# Patient Record
Sex: Male | Born: 1980
Health system: Southern US, Community
[De-identification: ages and names within clinical notes are randomized; demographics above are authoritative.]

## PROBLEM LIST (undated history)

## (undated) DIAGNOSIS — J45909 Unspecified asthma, uncomplicated: Secondary | ICD-10-CM

## (undated) DIAGNOSIS — T8859XA Other complications of anesthesia, initial encounter: Secondary | ICD-10-CM

## (undated) DIAGNOSIS — T7840XA Allergy, unspecified, initial encounter: Secondary | ICD-10-CM

## (undated) DIAGNOSIS — R531 Weakness: Secondary | ICD-10-CM

## (undated) DIAGNOSIS — T4145XA Adverse effect of unspecified anesthetic, initial encounter: Secondary | ICD-10-CM

## (undated) HISTORY — DX: Allergy, unspecified, initial encounter: T78.40XA

## (undated) HISTORY — PX: HERNIA REPAIR: SHX51

## (undated) HISTORY — DX: Unspecified asthma, uncomplicated: J45.909

## (undated) HISTORY — PX: HAND SURGERY: SHX662

---
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
## 2006-02-16 ENCOUNTER — Emergency Department (HOSPITAL_COMMUNITY): Admission: EM | Admit: 2006-02-16 | Discharge: 2006-02-16 | Payer: Self-pay | Admitting: Emergency Medicine

## 2008-11-19 ENCOUNTER — Ambulatory Visit: Payer: Self-pay | Admitting: Radiology

## 2008-11-19 ENCOUNTER — Emergency Department (HOSPITAL_BASED_OUTPATIENT_CLINIC_OR_DEPARTMENT_OTHER): Admission: EM | Admit: 2008-11-19 | Discharge: 2008-11-19 | Payer: Self-pay | Admitting: Emergency Medicine

## 2011-06-25 ENCOUNTER — Emergency Department (HOSPITAL_BASED_OUTPATIENT_CLINIC_OR_DEPARTMENT_OTHER)
Admission: EM | Admit: 2011-06-25 | Discharge: 2011-06-25 | Disposition: A | Discharge status: Home / Self Care | Payer: Self-pay | Attending: Emergency Medicine | Admitting: Emergency Medicine

## 2011-06-25 ENCOUNTER — Emergency Department (HOSPITAL_BASED_OUTPATIENT_CLINIC_OR_DEPARTMENT_OTHER): Payer: Self-pay

## 2011-06-25 ENCOUNTER — Encounter (HOSPITAL_BASED_OUTPATIENT_CLINIC_OR_DEPARTMENT_OTHER): Payer: Self-pay | Admitting: Family Medicine

## 2011-06-25 DIAGNOSIS — S91312A Laceration without foreign body, left foot, initial encounter: Secondary | ICD-10-CM

## 2011-06-25 DIAGNOSIS — W268XXA Contact with other sharp object(s), not elsewhere classified, initial encounter: Secondary | ICD-10-CM | POA: Insufficient documentation

## 2011-06-25 DIAGNOSIS — S91309A Unspecified open wound, unspecified foot, initial encounter: Secondary | ICD-10-CM | POA: Insufficient documentation

## 2011-06-25 MED ORDER — LIDOCAINE HCL 2 % IJ SOLN
INTRAMUSCULAR | Status: AC
  Administered 2011-06-25: 40 mg
  Filled 2011-06-25: qty 1

## 2011-06-25 NOTE — ED Provider Notes (Signed)
Medical screening examination/treatment/procedure(s) were performed by non-physician practitioner and as supervising physician I was immediately available for consultation/collaboration.   Carylon Tamburro, MD 06/25/11 2353 

## 2011-06-25 NOTE — ED Provider Notes (Signed)
History     CSN: [id number]  Arrival date & time 06/25/11  1733   First MD Initiated Contact with Patient 06/25/11 2026      Chief Complaint  Patient presents with  . Extremity Laceration    (Consider location/radiation/quality/duration/timing/severity/associated sxs/prior treatment) Patient is a 31 y.o. male presenting with skin laceration. The history is provided by the patient. No language interpreter was used.  Laceration  The incident occurred 1 to 2 hours ago. The laceration is located on the left foot. The laceration is 2 cm in size. The laceration mechanism was a a metal edge. The pain is at a severity of 4/10. The pain is moderate. He reports no foreign bodies present. His tetanus status is UTD.   Pt complains of a laceration between his 4th adn 5th toe History reviewed. No pertinent past medical history.  Past Surgical History  Procedure Date  . Hernia repair   . Hand surgery     No family history on file.  History  Substance Use Topics  . Smoking status: Never Smoker   . Smokeless tobacco: Not on file  . Alcohol Use: Yes      Review of Systems  Skin: Positive for wound.  All other systems reviewed and are negative.    Allergies  Review of patient's allergies indicates no known allergies.  Home Medications  No current outpatient prescriptions on file.  BP 130/82  Pulse 84  Temp 98.2 F (36.8 C) (Oral)  Resp 15  Ht 6\' 1"  (1.854 m)  Wt 228 lb (103.42 kg)  BMI 30.08 kg/m2  SpO2 99%  Physical Exam  Nursing note and vitals reviewed. Constitutional: He is oriented to person, place, and time. He appears well-developed and well-nourished.  Musculoskeletal: He exhibits tenderness.       2 cm laceration between 4th and 5th finger  Neurological: He is alert and oriented to person, place, and time. He has normal reflexes.  Skin: Skin is warm.  Psychiatric: He has a normal mood and affect.    ED Course  LACERATION REPAIR Date/Time: 06/25/2011 9:11  PM Performed by: Elson Areas Authorized by: Elson Areas Consent: Verbal consent obtained. Consent given by: patient Body area: lower extremity Laceration length: 2 cm Tendon involvement: none Nerve involvement: none Vascular damage: no Anesthesia: local infiltration Local anesthetic: lidocaine 2% without epinephrine Irrigation solution: saline Skin closure: 5-0 nylon Number of sutures: 2 Technique: simple Approximation: loose Approximation difficulty: simple Patient tolerance: Patient tolerated the procedure well with no immediate complications.   (including critical care time)  Labs Reviewed - No data to display Dg Foot Complete Left  06/25/2011  *RADIOLOGY REPORT*  Clinical Data: 31 year old male with laceration on the plantar side of the left fifth toe.  LEFT FOOT - COMPLETE 3+ VIEW  Comparison: None.  Findings: Bone mineralization is within normal limits.  Normal joint spaces.  Calcaneus intact.  Osseous structures of the left fifth ray appear intact and within normal limits.  No acute fracture identified. No radiopaque foreign body identified.  No subcutaneous gas identified.  IMPRESSION: No acute osseous abnormality identified about the left foot.  No radiopaque foreign body identified.  Original Report Authenticated By: Harley Hallmark, M.D.     1. Laceration of left foot       MDM  Suture removal in 8 days        Lonia Skinner [place], [place] 06/25/11 2112

## 2011-06-25 NOTE — ED Notes (Signed)
Pt has laceration from wire basket to left foot between pinky and 4th toes. Bleeding controlled.

## 2011-06-25 NOTE — Discharge Instructions (Signed)

## 2013-08-09 DIAGNOSIS — S93409A Sprain of unspecified ligament of unspecified ankle, initial encounter: Secondary | ICD-10-CM | POA: Insufficient documentation

## 2014-02-24 ENCOUNTER — Encounter (HOSPITAL_BASED_OUTPATIENT_CLINIC_OR_DEPARTMENT_OTHER): Payer: Self-pay

## 2014-02-24 ENCOUNTER — Emergency Department (HOSPITAL_BASED_OUTPATIENT_CLINIC_OR_DEPARTMENT_OTHER)
Admission: EM | Admit: 2014-02-24 | Discharge: 2014-02-24 | Discharge status: Against Medical Advice | Payer: Self-pay | Attending: Emergency Medicine | Admitting: Emergency Medicine

## 2014-02-24 DIAGNOSIS — M545 Low back pain: Secondary | ICD-10-CM | POA: Insufficient documentation

## 2014-02-24 NOTE — ED Notes (Addendum)
States he was moving boxes yesterday-pain to lower back today-states he took a flexeril and it has helped with pain

## 2015-05-25 ENCOUNTER — Emergency Department (HOSPITAL_BASED_OUTPATIENT_CLINIC_OR_DEPARTMENT_OTHER)
Admission: EM | Admit: 2015-05-25 | Discharge: 2015-05-25 | Disposition: A | Discharge status: Home / Self Care | Payer: Self-pay | Attending: Emergency Medicine | Admitting: Emergency Medicine

## 2015-05-25 ENCOUNTER — Encounter (HOSPITAL_BASED_OUTPATIENT_CLINIC_OR_DEPARTMENT_OTHER): Payer: Self-pay

## 2015-05-25 DIAGNOSIS — J45909 Unspecified asthma, uncomplicated: Secondary | ICD-10-CM | POA: Insufficient documentation

## 2015-05-25 DIAGNOSIS — K432 Incisional hernia without obstruction or gangrene: Secondary | ICD-10-CM | POA: Insufficient documentation

## 2015-05-25 NOTE — ED Provider Notes (Signed)
CSN: [id number]     Arrival date & time 05/25/15  4098 History   First MD Initiated Contact with Patient 05/25/15 [phone]     Chief Complaint  Patient presents with  . Hernia   HPI  Sean Knight is a 35 year old man with no known medical problems here with a ventral hernia.  He had an umbilical hernia surgically repaired back in 2006; for the last year he has had a hernia about 4" superior to the previous defect. This grew to the size of a baseball yesterday after lifting heavy crates at the [employer] and has been tender to the touch. He was not able to reduce it himself. He denies any nausea, vomiting, and his last bowel movement was this morning.  History reviewed. No pertinent past medical history. Past Surgical History  Procedure Laterality Date  . Hernia repair    . Hand surgery     No family history on file. Social History  Substance Use Topics  . Smoking status: Never Smoker   . Smokeless tobacco: Not on file  . Alcohol Use: Yes    Review of Systems  Constitutional: Negative for fever and chills.  Gastrointestinal: Positive for abdominal pain. Negative for nausea, vomiting, constipation and abdominal distention.  Genitourinary: Negative for dysuria.  Skin: Negative for rash and wound.  Neurological: Negative for dizziness, syncope and weakness.   Allergies  Review of patient's allergies indicates no known allergies.  Home Medications   Prior to Admission medications   Not on File   BP 141/91 mmHg  Pulse 81  Temp(Src) 99 F (37.2 C) (Oral)  Resp 18  Ht  (1.854 m)  Wt 117.028 kg  BMI 34.05 kg/m2  SpO2 99% Physical Exam  Constitutional: He is oriented to person, place, and time. He appears well-developed and well-nourished.  HENT:  Head: Normocephalic and atraumatic.  Eyes: Conjunctivae and EOM are normal. Pupils are equal, round, and reactive to light.  Neck: Normal range of motion. Neck supple.  Cardiovascular: Normal rate, regular rhythm and  normal heart sounds.   Pulmonary/Chest: Effort normal and breath sounds normal.  Abdominal: Soft. Bowel sounds are normal.  There is a baseball-sized ventral hernia about 4" superior to his umbilicus. I was able to reduce this entirely but it re-appeared to the size of a golf-ball after minor valsalva.  Neurological: He is alert and oriented to person, place, and time.  Skin: Skin is warm and dry.  Psychiatric: He has a normal mood and affect. His behavior is normal.    ED Course  Procedures (including critical care time) Labs Review Labs Reviewed - No data to display  Imaging Review No results found. I have personally reviewed and evaluated these images and lab results as part of my medical decision-making.   EKG Interpretation None      MDM   Final diagnoses:  Recurrent ventral hernia   Mr. Capell is a friendly 35 year old man with no known past medical history here with a recurrent ventral hernia without signs of strangulation nor obstruction. I was able to reduce this completely, but it reappeared with minor Valsalva, albeit at a much smaller size. His pain was significantly relieved with reduction. I think that he has a significant defect in his rectus abdominis, and warrants nonurgent surgical evaluation. He has follow-up with his primary doctor this Friday, and will request a referral at that time. I asked him to avoid lifting anything over 10lbs and come back should he develop  severe pain over the hernia, nausea, vomiting, or constipation suggestive of strangulation or obstruction.   Sean CooleyKyle Quintyn Dombek, MD 05/25/15 [id number]  Sean Lyonsouglas Delo, MD 05/25/15 [phone]

## 2015-05-25 NOTE — Discharge Instructions (Signed)
Hernia, Adult A hernia is the bulging of an organ or tissue through a weak spot in the muscles of the abdomen (abdominal wall). Hernias develop most often near the navel or groin. There are many kinds of hernias. Common kinds include:  Femoral hernia. This kind of hernia develops under the groin in the upper thigh area.  Inguinal hernia. This kind of hernia develops in the groin or scrotum.  Umbilical hernia. This kind of hernia develops near the navel.  Hiatal hernia. This kind of hernia causes part of the stomach to be pushed up into the chest.  Incisional hernia. This kind of hernia bulges through a scar from an abdominal surgery. CAUSES This condition may be caused by:  Heavy lifting.  Coughing over a long period of time.  Straining to have a bowel movement.  An incision made during an abdominal surgery.  A birth defect (congenital defect).  Excess weight or obesity.  Smoking.  Poor nutrition.  Cystic fibrosis.  Excess fluid in the abdomen.  Undescended testicles. SYMPTOMS Symptoms of a hernia include:  A lump on the abdomen. This is the first sign of a hernia. The lump may become more obvious with standing, straining, or coughing. It may get bigger over time if it is not treated or if the condition causing it is not treated.  Pain. A hernia is usually painless, but it may become painful over time if treatment is delayed. The pain is usually dull and may get worse with standing or lifting heavy objects. Sometimes a hernia gets tightly squeezed in the weak spot (strangulated) or stuck there (incarcerated) and causes additional symptoms. These symptoms may include:  Vomiting.  Nausea.  Constipation.  Irritability. DIAGNOSIS A hernia may be diagnosed with:  A physical exam. During the exam your health care provider may ask you to cough or to make a specific movement, because a hernia is usually more visible when you move.  Imaging tests. These can  include:  X-rays.  Ultrasound.  CT scan. TREATMENT A hernia that is small and painless may not need to be treated. A hernia that is large or painful may be treated with surgery. Inguinal hernias may be treated with surgery to prevent incarceration or strangulation. Strangulated hernias are always treated with surgery, because lack of blood to the trapped organ or tissue can cause it to die. Surgery to treat a hernia involves pushing the bulge back into place and repairing the weak part of the abdomen. HOME CARE INSTRUCTIONS  Avoid straining.  Do not lift anything heavier than 10 lb (4.5 kg).  Lift with your leg muscles, not your back muscles. This helps avoid strain.  When coughing, try to cough gently.  Prevent constipation. Constipation leads to straining with bowel movements, which can make a hernia worse or cause a hernia repair to break down. You can prevent constipation by:  Eating a high-fiber diet that includes plenty of fruits and vegetables.  Drinking enough fluids to keep your urine clear or pale yellow. Aim to drink 6-8 glasses of water per day.  Using a stool softener as directed by your health care provider.  Lose weight, if you are overweight.  Do not use any tobacco products, including cigarettes, chewing tobacco, or electronic cigarettes. If you need help quitting, ask your health care provider.  Keep all follow-up visits as directed by your health care provider. This is important. Your health care provider may need to monitor your condition. SEEK MEDICAL CARE IF:  You have   swelling, redness, and pain in the affected area.  Your bowel habits change. SEEK IMMEDIATE MEDICAL CARE IF:  You have a fever.  You have abdominal pain that is getting worse.  You feel nauseous or you vomit.  You cannot push the hernia back in place by gently pressing on it while you are lying down.  The hernia:  Changes in shape or size.  Is stuck outside the  abdomen.  Becomes discolored.  Feels hard or tender.   This information is not intended to replace advice given to you by your health care provider. Make sure you discuss any questions you have with your health care provider.   Document Released: 12/18/2004 Document Revised: 01/08/2014 Document Reviewed: 10/28/2013 Elsevier Interactive Patient Education 2016 [employer]  

## 2015-05-25 NOTE — ED Notes (Signed)
Pt c/o of a knot approx 3 inches above umbilicus and states that the area is tender. Pt states that it feels like a hernia that he has had in the past. Pt states it becomes worse when he does a lot of heavy lifting at work.

## 2015-05-25 NOTE — ED Notes (Signed)
Flores, MD at bedside

## 2015-05-26 ENCOUNTER — Telehealth: Payer: Self-pay

## 2015-05-26 NOTE — Telephone Encounter (Signed)
Pre visit call completed 

## 2015-05-27 ENCOUNTER — Encounter: Payer: Self-pay | Admitting: Physician Assistant

## 2015-05-27 ENCOUNTER — Ambulatory Visit (INDEPENDENT_AMBULATORY_CARE_PROVIDER_SITE_OTHER): Payer: Self-pay | Admitting: Physician Assistant

## 2015-05-27 VITALS — BP 118/74 | HR 83 | Temp 98.7°F | Ht 73.0 in | Wt 262.1 lb

## 2015-05-27 DIAGNOSIS — K439 Ventral hernia without obstruction or gangrene: Secondary | ICD-10-CM

## 2015-05-27 MED ORDER — TRAMADOL HCL 50 MG PO TABS: 50.0000 mg | ORAL_TABLET | Freq: Two times a day (BID) | ORAL | Status: DC | PRN

## 2015-05-27 MED FILL — traMADol HCL 50 MG TABS: 50 | 10 days supply | Qty: 20 | Fill #0

## 2015-05-27 NOTE — Progress Notes (Signed)
Pre visit review using our clinic review tool, if applicable. No additional management support is needed unless otherwise documented below in the visit note. 

## 2015-05-27 NOTE — Patient Instructions (Signed)
Please continue to limit lifting -- nothing more than 10 pounds.  Wear the weight belt. Take tramadol as directed if needed for moderate to severe pain. Otherwise tylenol will work.  You will be contacted by Surgery for assessment. If you develop any significant pain or inability to reduce the hernia or inability to use the restroom, please go to the ER.

## 2015-05-27 NOTE — Progress Notes (Signed)
   Patient presents to clinic today to establish care.  Patient seen in ER on 05/25/15 with complaints of pain at site of ventral hernia. MD was able to reduce the hernia without issue and resolution of pain. Recurrence of herniation noted with slightest increased in abdominal pressure. No emergent symptoms were present so outpatient FU with PCP recommended to set up Surgery consult.   Since that time, patient endorses intermittent abdominal pain at the site. Denies constipation or obstipation. Has been avoiing heavy lifting. Endorses goof urinary and bowel output. No pain at present.  Past Medical History  Diagnosis Date  . Asthma   . Tuberculosis     Past Surgical History  Procedure Laterality Date  . Hernia repair    . Hand surgery      No current outpatient prescriptions on file prior to visit.   No current facility-administered medications on file prior to visit.    No Known Allergies  Family History  Problem Relation Age of Onset  . Hyperlipidemia Father     Social History   Social History  . Marital Status: Married    Spouse Name: N/A  . Number of Children: N/A  . Years of Education: N/A   Occupational History  . Not on file.   Social History Main Topics  . Smoking status: Never Smoker   . Smokeless tobacco: Not on file  . Alcohol Use: Yes  . Drug Use: No  . Sexual Activity: Not on file   Other Topics Concern  . Not on file   Social History Narrative    Review of Systems  Constitutional: Negative for fever and malaise/fatigue.  Cardiovascular: Negative for chest pain and palpitations.  Gastrointestinal: Positive for abdominal pain. Negative for heartburn, nausea, vomiting, diarrhea, constipation, blood in stool and melena.  Genitourinary: Negative for dysuria, urgency, frequency, hematuria and flank pain.    BP 118/74 mmHg  Pulse 83  Temp(Src) 98.7 F (37.1 C) (Oral)  Ht 6\' 1"  (1.854 m)  Wt 262 lb 2 oz (118.899 kg)  BMI 34.59 kg/m2  SpO2  96%  Physical Exam  Constitutional: He is oriented to person, place, and time and well-developed, well-nourished, and in no distress.  HENT:  Head: Normocephalic and atraumatic.  Cardiovascular: Normal rate, regular rhythm, normal heart sounds and intact distal pulses.   Pulmonary/Chest: Effort normal and breath sounds normal. No respiratory distress. He has no wheezes. He has no rales. He exhibits no tenderness.  Abdominal: A hernia is present. Hernia confirmed positive in the ventral area.    Neurological: He is alert and oriented to person, place, and time.  Skin: Skin is warm and dry. No rash noted.  Psychiatric: Affect normal.  Vitals reviewed.   No results found for this or any previous visit (from the past 2160 hour(s)).  Assessment/Plan: 1. Ventral hernia without obstruction or gangrene Symptomatic but no sign of strangulation or incarceration. No lifting > 10 pounds. Supportive measures and Pain relief reviewed. Referral to Surgery placed. Alarm signs/symptoms reviewed with patient and family that would prompt return to ER.  - Ambulatory referral to General Surgery

## 2015-06-06 ENCOUNTER — Ambulatory Visit: Payer: Self-pay | Admitting: Surgery

## 2015-06-06 NOTE — H&P (Signed)
History of Present Illness Sean Knight(Sean Knight; 06/06/2015 9:31 AM) Patient words: hernia.  The patient is a 35 year old male who presents with an abdominal wall hernia. Patient referred by Sean MatesWilliam Martin PA for ventral hernia This is a 35 year old male who is status post umbilical hernia repair with mesh at [facility] in 2006. The patient works [employer] and frequently has to lift heavy objects. About 3 weeks ago he felt discomfort in his epigastrium. Subsequently he developed a bulge. This has become fairly large. He was evaluated in the emergency department and they were able to reduce this. He denies any obstructive symptoms. There is no imaging performed. He is now referred for surgical evaluation for hernia repair.   Other Problems (Sean Eversole, LPN; 1/6/10966/05/2015 0:459:08 AM) Asthma Gastroesophageal Reflux Disease Umbilical Hernia Repair  Past Surgical History (Sean Eversole, LPN; 4/0/98116/05/2015 9:149:08 AM) Ventral / Umbilical Hernia Surgery Right.  Medication History (Sean Eversole, LPN; 7/8/29566/05/2015 2:139:09 AM) No Current Medications Medications Reconciled  Social History (Sean Eversole, LPN; 0/8/65786/05/2015 4:699:08 AM) Alcohol use Occasional alcohol use. Caffeine use Tea. No drug use Tobacco use Former smoker.  Family History (Sean Pillingmmie Eversole, LPN; 6/2/95286/05/2015 4:139:08 AM) Heart disease in male family member before age 35 Heart disease in male family member before age 35 Hypertension Father.     Review of Systems (Sean Eversole LPN; 2/4/40106/05/2015 2:729:08 AM) General Not Present- Appetite Loss, Chills, Fatigue, Fever, Night Sweats, Weight Gain and Weight Loss. Skin Not Present- Change in Wart/Mole, Dryness, Hives, Jaundice, New Lesions, Non-Healing Wounds, Rash and Ulcer. HEENT Present- Seasonal Allergies and Wears glasses/contact lenses. Not Present- Earache, Hearing Loss, Hoarseness, Nose Bleed, Oral Ulcers, Ringing in the Ears, Sinus Pain, Sore Throat, Visual Disturbances and  Yellow Eyes. Respiratory Present- Snoring. Not Present- Bloody sputum, Chronic Cough, Difficulty Breathing and Wheezing. Breast Not Present- Breast Mass, Breast Pain, Nipple Discharge and Skin Changes. Cardiovascular Not Present- Chest Pain, Difficulty Breathing Lying Down, Leg Cramps, Palpitations, Rapid Heart Rate, Shortness of Breath and Swelling of Extremities. Gastrointestinal Present- Bloating and Excessive gas. Not Present- Abdominal Pain, Bloody Stool, Change in Bowel Habits, Chronic diarrhea, Constipation, Difficulty Swallowing, Gets full quickly at meals, Hemorrhoids, Indigestion, Nausea, Rectal Pain and Vomiting. Male Genitourinary Not Present- Blood in Urine, Change in Urinary Stream, Frequency, Impotence, Nocturia, Painful Urination, Urgency and Urine Leakage. Musculoskeletal Not Present- Back Pain, Joint Pain, Joint Stiffness, Muscle Pain, Muscle Weakness and Swelling of Extremities. Neurological Not Present- Decreased Memory, Fainting, Headaches, Numbness, Seizures, Tingling, Tremor, Trouble walking and Weakness. Psychiatric Not Present- Anxiety, Bipolar, Change in Sleep Pattern, Depression, Fearful and Frequent crying. Endocrine Not Present- Cold Intolerance, Excessive Hunger, Hair Changes, Heat Intolerance, Hot flashes and New Diabetes. Hematology Not Present- Easy Bruising, Excessive bleeding, Gland problems, HIV and Persistent Infections.  Vitals (Sean Eversole LPN; 5/3/66446/05/2015 0:349:08 AM) 06/06/2015 9:08 AM Weight: 257.8 lb Height: 73in Body Surface Area: 2.4 m Body Mass Index: 34.01 kg/m  Temp.: 98.31F(Oral)  Pulse: 78 (Regular)  BP: 122/76 (Sitting, Left Arm, Standard)      Physical Exam Molli Hazard([name] Knight; 06/06/2015 9:32 AM)  The physical exam findings are as follows: Note:WDWN in NAD HEENT: EOMI, sclera anicteric Neck: No masses, no thyromegaly Lungs: CTA bilaterally; normal respiratory effort CV: Regular rate and rhythm; no murmurs Abd: +bowel sounds,  soft, non-tender, healed umbilical incision Epigastrium - 4 cm midline ventral hernia - reducible; there may be a smaller hernia just below this larger hernia Ext: Well-perfused; no edema Skin: Warm, dry;  no sign of jaundice    Assessment & Plan ([name] Knight; 06/06/2015 9:26 AM)  VENTRAL HERNIA (K43.9)  Current Plans Schedule for Surgery - laparoscopic ventral hernia repair with mesh. The surgical procedure has been discussed with the patient. Potential risks, benefits, alternative treatments, and expected outcomes have been explained. All of the patient's questions at this time have been answered. The likelihood of reaching the patient's treatment goal is good. The patient understand the proposed surgical procedure and wishes to proceed. 

## 2015-06-10 ENCOUNTER — Encounter (HOSPITAL_COMMUNITY)
Admission: RE | Admit: 2015-06-10 | Discharge: 2015-06-10 | Disposition: A | Discharge status: Home / Self Care | Payer: Self-pay | Source: Ambulatory Visit | Attending: Surgery | Admitting: Surgery

## 2015-06-10 ENCOUNTER — Encounter (HOSPITAL_COMMUNITY): Payer: Self-pay

## 2015-06-10 DIAGNOSIS — K439 Ventral hernia without obstruction or gangrene: Secondary | ICD-10-CM | POA: Insufficient documentation

## 2015-06-10 DIAGNOSIS — Z01812 Encounter for preprocedural laboratory examination: Secondary | ICD-10-CM | POA: Insufficient documentation

## 2015-06-10 HISTORY — DX: Adverse effect of unspecified anesthetic, initial encounter: T41.45XA

## 2015-06-10 HISTORY — DX: Weakness: R53.1

## 2015-06-10 HISTORY — DX: Other complications of anesthesia, initial encounter: T88.59XA

## 2015-06-10 LAB — CBC
HCT: 44.9 % (ref 39.0–52.0)
Hemoglobin: 14.9 g/dL (ref 13.0–17.0)
MCH: 28.2 pg (ref 26.0–34.0)
MCHC: 33.2 g/dL (ref 30.0–36.0)
MCV: 84.9 fL (ref 78.0–100.0)
PLATELETS: 312 10*3/uL (ref 150–400)
RBC: 5.29 MIL/uL (ref 4.22–5.81)
RDW: 12.3 % (ref 11.5–15.5)
WBC: 9.2 10*3/uL (ref 4.0–10.5)

## 2015-06-10 MED ORDER — CHLORHEXIDINE GLUCONATE 4 % EX LIQD: 1.0000 "application " | Freq: Once | CUTANEOUS | Status: DC

## 2015-06-10 NOTE — Progress Notes (Addendum)
Cardiologist denies  PA [name]   Echo denies  Stress test denies  Heart cath denies  EKG denies  CXR denies

## 2015-06-10 NOTE — Pre-Procedure Instructions (Signed)
Sean Knight  06/10/2015      [facility] [facility] - [place] - [address] [place] [address] Phone: [phone] Fax: [phone]    Your procedure is scheduled on Tues, June 13 @ 2:00 PM  [facility] Admitting at 12:00 PM  Call this number if you have problems the morning of surgery:  [phone]   Remember:  Do not eat food or drink liquids after midnight.  Take these medicines the morning of surgery with A SIP OF WATER Tramadol(Ultram)              No Goody's,BC's,Aleve,Advil,Motrin,Fish Oil,Ibuprofen,or any Herbal Medications.    Do not wear jewelry.  Do not wear lotions, powders, or colognes.              Men may shave face and neck.  Do not bring valuables to the hospital.  [facility] is not responsible for any belongings or valuables.  Contacts, dentures or bridgework may not be worn into surgery.  Leave your suitcase in the car.  After surgery it may be brought to your room.  For patients admitted to the hospital, discharge time will be determined by your treatment team.  Patients discharged the day of surgery will not be allowed to drive home.    Special instructions:   [facility] - Preparing for Surgery  Before surgery, you can play an important role.  Because skin is not sterile, your skin needs to be as free of germs as possible.  You can reduce the number of germs on you skin by washing with CHG (chlorahexidine gluconate) soap before surgery.  CHG is an antiseptic cleaner which kills germs and bonds with the skin to continue killing germs even after washing.  Please DO NOT use if you have an allergy to CHG or antibacterial soaps.  If your skin becomes reddened/irritated stop using the CHG and inform your nurse when you arrive at Short Stay.  Do not shave (including legs and underarms) for at least 48 hours prior to the first CHG shower.  You may shave your  face.  Please follow these instructions carefully:   1.  Shower with CHG Soap the night before surgery and the                                morning of Surgery.  2.  If you choose to wash your hair, wash your hair first as usual with your       normal shampoo.  3.  After you shampoo, rinse your hair and body thoroughly to remove the                      Shampoo.  4.  Use CHG as you would any other liquid soap.  You can apply chg directly       to the skin and wash gently with scrungie or a clean washcloth.  5.  Apply the CHG Soap to your body ONLY FROM THE NECK DOWN.        Do not use on open wounds or open sores.  Avoid contact with your eyes,       ears, mouth and genitals (private parts).  Wash genitals (private parts)       with your normal soap.  6.  Wash thoroughly, paying  special attention to the area where your surgery        will be performed.  7.  Thoroughly rinse your body with warm water from the neck down.  8.  DO NOT shower/wash with your normal soap after using and rinsing off       the CHG Soap.  9.  Pat yourself dry with a clean towel.            10.  Wear clean pajamas.            11.  Place clean sheets on your bed the night of your first shower and do not        sleep with pets.  Day of Surgery  Do not apply any lotions/deoderants the morning of surgery.  Please wear clean clothes to the hospital/surgery center.

## 2015-06-10 NOTE — Progress Notes (Signed)
Pt states that about 12 yrs ago he was in the [employer] and had to be tested for TB as precautionary. Series of CXR's,never diagnosed with this but did have a series of testing done.

## 2015-06-13 MED ORDER — DEXTROSE 5 % IV SOLN
3.0000 g | INTRAVENOUS | Status: AC
  Administered 2015-06-14: 3 g via INTRAVENOUS
  Filled 2015-06-13: qty 3000

## 2015-06-14 ENCOUNTER — Ambulatory Visit (HOSPITAL_COMMUNITY)
Admission: RE | Admit: 2015-06-14 | Discharge: 2015-06-14 | Disposition: A | Discharge status: Home / Self Care | Payer: Self-pay | Source: Ambulatory Visit | Attending: Surgery | Admitting: Surgery

## 2015-06-14 ENCOUNTER — Ambulatory Visit (HOSPITAL_COMMUNITY): Payer: MEDICAID | Admitting: Certified Registered"

## 2015-06-14 ENCOUNTER — Encounter (HOSPITAL_COMMUNITY): Payer: Self-pay | Admitting: Certified Registered"

## 2015-06-14 ENCOUNTER — Encounter (HOSPITAL_COMMUNITY)
Admission: RE | Disposition: A | Discharge status: Home / Self Care | Payer: Self-pay | Source: Ambulatory Visit | Attending: Surgery

## 2015-06-14 DIAGNOSIS — Z5331 Laparoscopic surgical procedure converted to open procedure: Secondary | ICD-10-CM | POA: Insufficient documentation

## 2015-06-14 DIAGNOSIS — Z87891 Personal history of nicotine dependence: Secondary | ICD-10-CM | POA: Insufficient documentation

## 2015-06-14 DIAGNOSIS — Z6830 Body mass index (BMI) 30.0-30.9, adult: Secondary | ICD-10-CM | POA: Insufficient documentation

## 2015-06-14 DIAGNOSIS — K219 Gastro-esophageal reflux disease without esophagitis: Secondary | ICD-10-CM | POA: Insufficient documentation

## 2015-06-14 DIAGNOSIS — K439 Ventral hernia without obstruction or gangrene: Secondary | ICD-10-CM | POA: Insufficient documentation

## 2015-06-14 HISTORY — PX: LAPAROSCOPIC ASSISTED VENTRAL HERNIA REPAIR: SHX6312

## 2015-06-14 HISTORY — PX: INSERTION OF MESH: SHX5868

## 2015-06-14 SURGERY — INSERTION OF MESH
Anesthesia: General | Site: Abdomen

## 2015-06-14 MED ORDER — FENTANYL CITRATE (PF) 100 MCG/2ML IJ SOLN
25.0000 ug | INTRAMUSCULAR | Status: DC | PRN
  Administered 2015-06-14 (×2): 50 ug via INTRAVENOUS

## 2015-06-14 MED ORDER — ONDANSETRON HCL 4 MG/2ML IJ SOLN: 4.0000 mg | INTRAMUSCULAR | Status: DC | PRN

## 2015-06-14 MED ORDER — LIDOCAINE HCL (CARDIAC) 20 MG/ML IV SOLN: INTRAVENOUS | Status: DC | PRN

## 2015-06-14 MED ORDER — ACETAMINOPHEN 160 MG/5ML PO SOLN
325.0000 mg | ORAL | Status: DC | PRN
  Filled 2015-06-14: qty 20.3

## 2015-06-14 MED ORDER — ROCURONIUM BROMIDE 100 MG/10ML IV SOLN
INTRAVENOUS | Status: DC | PRN
  Administered 2015-06-14: 50 mg via INTRAVENOUS
  Administered 2015-06-14: 10 mg via INTRAVENOUS

## 2015-06-14 MED ORDER — BUPIVACAINE-EPINEPHRINE 0.25% -1:200000 IJ SOLN
INTRAMUSCULAR | Status: DC | PRN
  Administered 2015-06-14: 14 mL

## 2015-06-14 MED ORDER — BUPIVACAINE-EPINEPHRINE (PF) 0.25% -1:200000 IJ SOLN
INTRAMUSCULAR | Status: AC
  Filled 2015-06-14: qty 30

## 2015-06-14 MED ORDER — NEOSTIGMINE METHYLSULFATE 10 MG/10ML IV SOLN
INTRAVENOUS | Status: DC | PRN
  Administered 2015-06-14: 3 mg via INTRAVENOUS

## 2015-06-14 MED ORDER — OXYCODONE HCL 5 MG/5ML PO SOLN: 5.0000 mg | Freq: Once | ORAL | Status: AC | PRN

## 2015-06-14 MED ORDER — ONDANSETRON HCL 4 MG/2ML IJ SOLN
INTRAMUSCULAR | Status: DC | PRN
  Administered 2015-06-14: 4 mg via INTRAVENOUS

## 2015-06-14 MED ORDER — MIDAZOLAM HCL 2 MG/2ML IJ SOLN
INTRAMUSCULAR | Status: AC
  Filled 2015-06-14: qty 2

## 2015-06-14 MED ORDER — LACTATED RINGERS IV SOLN
INTRAVENOUS | Status: DC
  Administered 2015-06-14 (×3): via INTRAVENOUS

## 2015-06-14 MED ORDER — FENTANYL CITRATE (PF) 100 MCG/2ML IJ SOLN
INTRAMUSCULAR | Status: DC | PRN
  Administered 2015-06-14: 200 ug via INTRAVENOUS
  Administered 2015-06-14 (×6): 50 ug via INTRAVENOUS

## 2015-06-14 MED ORDER — FENTANYL CITRATE (PF) 250 MCG/5ML IJ SOLN
INTRAMUSCULAR | Status: AC
  Filled 2015-06-14: qty 5

## 2015-06-14 MED ORDER — FENTANYL CITRATE (PF) 100 MCG/2ML IJ SOLN
INTRAMUSCULAR | Status: AC
  Filled 2015-06-14: qty 2

## 2015-06-14 MED ORDER — AMMONIA AROMATIC IN INHA
RESPIRATORY_TRACT | Status: AC
  Filled 2015-06-14: qty 10

## 2015-06-14 MED ORDER — ACETAMINOPHEN 325 MG PO TABS: 325.0000 mg | ORAL_TABLET | ORAL | Status: DC | PRN

## 2015-06-14 MED ORDER — PROPOFOL 10 MG/ML IV BOLUS
INTRAVENOUS | Status: AC
  Filled 2015-06-14: qty 20

## 2015-06-14 MED ORDER — MORPHINE SULFATE (PF) 2 MG/ML IV SOLN: 2.0000 mg | INTRAVENOUS | Status: DC | PRN

## 2015-06-14 MED ORDER — GLYCOPYRROLATE 0.2 MG/ML IJ SOLN
INTRAMUSCULAR | Status: DC | PRN
  Administered 2015-06-14: .6 mg via INTRAVENOUS

## 2015-06-14 MED ORDER — OXYCODONE HCL 5 MG PO TABS
5.0000 mg | ORAL_TABLET | Freq: Once | ORAL | Status: AC | PRN
  Administered 2015-06-14: 5 mg via ORAL

## 2015-06-14 MED ORDER — PROPOFOL 10 MG/ML IV BOLUS
INTRAVENOUS | Status: DC | PRN
  Administered 2015-06-14: 200 mg via INTRAVENOUS

## 2015-06-14 MED ORDER — OXYCODONE HCL 5 MG PO TABS
ORAL_TABLET | ORAL | Status: AC
  Filled 2015-06-14: qty 1

## 2015-06-14 MED ORDER — OXYCODONE-ACETAMINOPHEN 5-325 MG PO TABS: 1.0000 | ORAL_TABLET | ORAL | Status: DC | PRN

## 2015-06-14 MED ORDER — 0.9 % SODIUM CHLORIDE (POUR BTL) OPTIME
TOPICAL | Status: DC | PRN
  Administered 2015-06-14: 1000 mL

## 2015-06-14 SURGICAL SUPPLY — 62 items
APL SKNCLS STERI-STRIP NONHPOA (GAUZE/BANDAGES/DRESSINGS) ×1
APPLIER CLIP LOGIC TI 5 (MISCELLANEOUS) IMPLANT
APPLIER CLIP ROT 10 11.4 M/L (STAPLE)
APR CLP MED LRG 11.4X10 (STAPLE)
APR CLP MED LRG 33X5 (MISCELLANEOUS)
BENZOIN TINCTURE PRP APPL 2/3 (GAUZE/BANDAGES/DRESSINGS) ×4 IMPLANT
BINDER ABD UNIV 12 45-62 (WOUND CARE) ×2 IMPLANT
BINDER ABDOMINAL 46IN 62IN (WOUND CARE) ×4
BLADE SURG ROTATE 9660 (MISCELLANEOUS) ×4 IMPLANT
CANISTER SUCTION 2500CC (MISCELLANEOUS) ×4 IMPLANT
CHLORAPREP W/TINT 26ML (MISCELLANEOUS) ×4 IMPLANT
CLIP APPLIE ROT 10 11.4 M/L (STAPLE) IMPLANT
CLOSURE WOUND 1/2 X4 (GAUZE/BANDAGES/DRESSINGS) ×1
COVER SURGICAL LIGHT HANDLE (MISCELLANEOUS) ×4 IMPLANT
DECANTER SPIKE VIAL GLASS SM (MISCELLANEOUS) IMPLANT
DEVICE SECURE STRAP 25 ABSORB (INSTRUMENTS) ×4 IMPLANT
DEVICE TROCAR PUNCTURE CLOSURE (ENDOMECHANICALS) ×4 IMPLANT
DRAPE LAPAROSCOPIC ABDOMINAL (DRAPES) ×4 IMPLANT
DRSG TEGADERM 2-3/8X2-3/4 SM (GAUZE/BANDAGES/DRESSINGS) ×4 IMPLANT
DRSG TEGADERM 4X4.75 (GAUZE/BANDAGES/DRESSINGS) ×4 IMPLANT
ELECT CAUTERY BLADE 6.4 (BLADE) ×4 IMPLANT
ELECT REM PT RETURN 9FT ADLT (ELECTROSURGICAL) ×4
ELECTRODE REM PT RTRN 9FT ADLT (ELECTROSURGICAL) ×2 IMPLANT
FILTER SMOKE EVAC LAPAROSHD (FILTER) IMPLANT
GAUZE SPONGE 2X2 8PLY STRL LF (GAUZE/BANDAGES/DRESSINGS) ×2 IMPLANT
GLOVE BIO SURGEON STRL SZ7 (GLOVE) ×8 IMPLANT
GLOVE BIO SURGEON STRL SZ8 (GLOVE) ×4 IMPLANT
GLOVE BIOGEL PI IND STRL 7.0 (GLOVE) ×2 IMPLANT
GLOVE BIOGEL PI IND STRL 7.5 (GLOVE) ×4 IMPLANT
GLOVE BIOGEL PI INDICATOR 7.0 (GLOVE) ×2
GLOVE BIOGEL PI INDICATOR 7.5 (GLOVE) ×4
GLOVE SURG SS PI 6.5 STRL IVOR (GLOVE) ×4 IMPLANT
GOWN STRL REUS W/ TWL LRG LVL3 (GOWN DISPOSABLE) ×6 IMPLANT
GOWN STRL REUS W/TWL LRG LVL3 (GOWN DISPOSABLE) ×9
KIT BASIN OR (CUSTOM PROCEDURE TRAY) ×4 IMPLANT
KIT ROOM TURNOVER OR (KITS) ×4 IMPLANT
MARKER SKIN DUAL TIP RULER LAB (MISCELLANEOUS) ×4 IMPLANT
MESH VENTRALEX ST 1-7/10 CRC S (Mesh General) ×4 IMPLANT
NEEDLE SPNL 22GX3.5 QUINCKE BK (NEEDLE) ×4 IMPLANT
NS IRRIG 1000ML POUR BTL (IV SOLUTION) ×4 IMPLANT
PAD ARMBOARD 7.5X6 YLW CONV (MISCELLANEOUS) ×8 IMPLANT
PENCIL BUTTON HOLSTER BLD 10FT (ELECTRODE) ×4 IMPLANT
SCALPEL HARMONIC ACE (MISCELLANEOUS) ×4 IMPLANT
SCISSORS LAP 5X35 DISP (ENDOMECHANICALS) IMPLANT
SET IRRIG TUBING LAPAROSCOPIC (IRRIGATION / IRRIGATOR) IMPLANT
SLEEVE ENDOPATH XCEL 5M (ENDOMECHANICALS) ×4 IMPLANT
SPONGE GAUZE 2X2 STER 10/PKG (GAUZE/BANDAGES/DRESSINGS) ×2
STRIP CLOSURE SKIN 1/2X4 (GAUZE/BANDAGES/DRESSINGS) ×3 IMPLANT
SUT MNCRL AB 4-0 PS2 18 (SUTURE) ×4 IMPLANT
SUT NOVA NAB DX-16 0-1 5-0 T12 (SUTURE) ×4 IMPLANT
SUT NOVA NAB GS-21 0 18 T12 DT (SUTURE) ×4 IMPLANT
TOWEL OR 17X24 6PK STRL BLUE (TOWEL DISPOSABLE) ×4 IMPLANT
TOWEL OR 17X26 10 PK STRL BLUE (TOWEL DISPOSABLE) ×4 IMPLANT
TRAY FOLEY CATH 14FRSI W/METER (CATHETERS) ×4 IMPLANT
TRAY LAPAROSCOPIC MC (CUSTOM PROCEDURE TRAY) ×4 IMPLANT
TROCAR XCEL BLUNT TIP 100MML (ENDOMECHANICALS) IMPLANT
TROCAR XCEL NON-BLD 11X100MML (ENDOMECHANICALS) ×4 IMPLANT
TROCAR XCEL NON-BLD 5MMX100MML (ENDOMECHANICALS) ×4 IMPLANT
TUBE CONNECTING 12'X1/4 (SUCTIONS) ×1
TUBE CONNECTING 12X1/4 (SUCTIONS) ×3 IMPLANT
TUBING INSUFFLATION (TUBING) ×4 IMPLANT
YANKAUER SUCT BULB TIP NO VENT (SUCTIONS) ×4 IMPLANT

## 2015-06-14 NOTE — Anesthesia Postprocedure Evaluation (Signed)
Anesthesia Post Note  Patient: Sean Knight  Procedure(s) Performed: Procedure(s) (LRB): INSERTION OF MESH (N/A) LAPAROSCOPIC ASSISTED VENTRAL HERNIA REPAIR (N/A)  Patient location during evaluation: PACU Anesthesia Type: General Level of consciousness: awake and alert Pain management: pain level controlled Vital Signs Assessment: post-procedure vital signs reviewed and stable Respiratory status: spontaneous breathing, nonlabored ventilation, respiratory function stable and patient connected to nasal cannula oxygen Cardiovascular status: blood pressure returned to baseline and stable Postop Assessment: no signs of nausea or vomiting Anesthetic complications: no    Last Vitals:  Filed Vitals:   06/14/15 1630 06/14/15 1637  BP:  96/57  Pulse: 92 84  Temp:    Resp: 18 13    Last Pain:  Filed Vitals:   06/14/15 1703  PainSc: 4                  Tania Steinhauser,W. EDMOND

## 2015-06-14 NOTE — H&P (View-Only) (Signed)
History of Present Illness Sean Knight(Deryl Giroux K. Recia Sons MD; 06/06/2015 9:31 AM) Patient words: hernia.  The patient is a 35 year old male who presents with an abdominal wall hernia. Patient referred by [name] PA for ventral hernia This is a 35 year old male who is status post umbilical hernia repair [facility]. The patient works at [employer] and frequently has to lift heavy objects. About 3 weeks ago he felt discomfort in his epigastrium. Subsequently he developed a bulge. This has become fairly large. He was evaluated in the emergency department and they were able to reduce this. He denies any obstructive symptoms. There is no imaging performed. He is now referred for surgical evaluation for hernia repair.   Other Problems ([name], LPN; 1/6/10966/05/2015 0:459:08 AM) Asthma Gastroesophageal Reflux Disease Umbilical Hernia Repair  Past Surgical History ([name], LPN; 4/0/98116/05/2015 9:149:08 AM) Ventral / Umbilical Hernia Surgery Right.  Medication History ([name], LPN; 7/8/29566/05/2015 2:139:09 AM) No Current Medications Medications Reconciled  Social History ([name], LPN; 0/8/65786/05/2015 4:699:08 AM) Alcohol use Occasional alcohol use. Caffeine use Tea. No drug use Tobacco use Former smoker.  Family History ([name], LPN; 6/2/95286/05/2015 4:139:08 AM) Heart disease in male family member before age 35 Heart disease in male family member before age 35 Hypertension Father.     Review of Systems ([name] LPN; 2/4/40106/05/2015 2:729:08 AM) General Not Present- Appetite Loss, Chills, Fatigue, Fever, Night Sweats, Weight Gain and Weight Loss. Skin Not Present- Change in Wart/Mole, Dryness, Hives, Jaundice, New Lesions, Non-Healing Wounds, Rash and Ulcer. HEENT Present- Seasonal Allergies and Wears glasses/contact lenses. Not Present- Earache, Hearing Loss, Hoarseness, Nose Bleed, Oral Ulcers, Ringing in the Ears, Sinus Pain, Sore Throat, Visual Disturbances and  Yellow Eyes. Respiratory Present- Snoring. Not Present- Bloody sputum, Chronic Cough, Difficulty Breathing and Wheezing. Breast Not Present- Breast Mass, Breast Pain, Nipple Discharge and Skin Changes. Cardiovascular Not Present- Chest Pain, Difficulty Breathing Lying Down, Leg Cramps, Palpitations, Rapid Heart Rate, Shortness of Breath and Swelling of Extremities. Gastrointestinal Present- Bloating and Excessive gas. Not Present- Abdominal Pain, Bloody Stool, Change in Bowel Habits, Chronic diarrhea, Constipation, Difficulty Swallowing, Gets full quickly at meals, Hemorrhoids, Indigestion, Nausea, Rectal Pain and Vomiting. Male Genitourinary Not Present- Blood in Urine, Change in Urinary Stream, Frequency, Impotence, Nocturia, Painful Urination, Urgency and Urine Leakage. Musculoskeletal Not Present- Back Pain, Joint Pain, Joint Stiffness, Muscle Pain, Muscle Weakness and Swelling of Extremities. Neurological Not Present- Decreased Memory, Fainting, Headaches, Numbness, Seizures, Tingling, Tremor, Trouble walking and Weakness. Psychiatric Not Present- Anxiety, Bipolar, Change in Sleep Pattern, Depression, Fearful and Frequent crying. Endocrine Not Present- Cold Intolerance, Excessive Hunger, Hair Changes, Heat Intolerance, Hot flashes and New Diabetes. Hematology Not Present- Easy Bruising, Excessive bleeding, Gland problems, HIV and Persistent Infections.  Vitals ([name] LPN; 5/3/66446/05/2015 0:349:08 AM) 06/06/2015 9:08 AM Weight: 257.8 lb Height: 73in Body Surface Area: 2.4 m Body Mass Index: 34.01 kg/m  Temp.: 98.31F(Oral)  Pulse: 78 (Regular)  BP: 122/76 (Sitting, Left Arm, Standard)      Physical Exam Molli Hazard(Ryna Beckstrom K. Lagretta Loseke MD; 06/06/2015 9:32 AM)  The physical exam findings are as follows: Note:WDWN in NAD HEENT: EOMI, sclera anicteric Neck: No masses, no thyromegaly Lungs: CTA bilaterally; normal respiratory effort CV: Regular rate and rhythm; no murmurs Abd: +bowel sounds,  soft, non-tender, healed umbilical incision Epigastrium - 4 cm midline ventral hernia - reducible; there may be a smaller hernia just below this larger hernia Ext: Well-perfused; no edema Skin: Warm, dry;  no sign of jaundice    Assessment & Plan Sean Knight. Gabriella Guile MD; 06/06/2015 9:26 AM)  VENTRAL HERNIA (K43.9)  Current Plans Schedule for Surgery - laparoscopic ventral hernia repair with mesh. The surgical procedure has been discussed with the patient. Potential risks, benefits, alternative treatments, and expected outcomes have been explained. All of the patient's questions at this time have been answered. The likelihood of reaching the patient's treatment goal is good. The patient understand the proposed surgical procedure and wishes to proceed.

## 2015-06-14 NOTE — Interval H&P Note (Signed)
History and Physical Interval Note:  06/14/2015 12:45 PM  Sean Knight  has presented today for surgery, with the diagnosis of Ventral hernia  The various methods of treatment have been discussed with the patient and family. After consideration of risks, benefits and other options for treatment, the patient has consented to  Procedure(s): LAPAROSCOPIC VENTRAL HERNIA (N/A) INSERTION OF MESH (N/A) as a surgical intervention .  The patient's history has been reviewed, patient examined, no change in status, stable for surgery.  I have reviewed the patient's chart and labs.  Questions were answered to the patient's satisfaction.     Ardene Remley K.

## 2015-06-14 NOTE — Transfer of Care (Signed)
Immediate Anesthesia Transfer of Care Note  Patient: Sean Knight  Procedure(s) Performed: Procedure(s): INSERTION OF MESH (N/A) LAPAROSCOPIC ASSISTED VENTRAL HERNIA REPAIR (N/A)  Patient Location: PACU  Anesthesia Type:General  Level of Consciousness: awake, alert , oriented and patient cooperative  Airway & Oxygen Therapy: Patient Spontanous Breathing and Patient connected to nasal cannula oxygen  Post-op Assessment: Report given to RN and Post -op Vital signs reviewed and stable  Post vital signs: Reviewed and stable  Last Vitals:  Filed Vitals:   06/14/15 1220  BP: 146/94  Pulse: 85  Temp: 37.2 C  Resp: 18    Last Pain: There were no vitals filed for this visit.       Complications: No apparent anesthesia complications

## 2015-06-14 NOTE — Discharge Instructions (Signed)
[facility] ° °VENTRAL HERNIA REPAIR: POST OP INSTRUCTIONS ° °Always review your discharge instruction sheet given to you by the facility where your surgery was performed. °IF YOU HAVE DISABILITY OR FAMILY LEAVE FORMS, YOU MUST BRING THEM TO THE OFFICE FOR PROCESSING.   °DO NOT GIVE THEM TO YOUR DOCTOR. ° °1. A  prescription for pain medication may be given to you upon discharge.  Take your pain medication as prescribed, if needed.  If narcotic pain medicine is not needed, then you may take acetaminophen (Tylenol) or ibuprofen (Advil) as needed. °2. Take your usually prescribed medications unless otherwise directed. °3. If you need a refill on your pain medication, please contact your pharmacy.  They will contact our office to request authorization. Prescriptions will not be filled after 5 pm or on week-ends. °4. You should follow a light diet the first 24 hours after arrival home, such as soup and crackers, etc.  Be sure to include lots of fluids daily.  Resume your normal diet the day after surgery. °5. Most patients will experience some swelling and bruising around the umbilicus or in the groin and scrotum.  Ice packs and reclining will help.  Swelling and bruising can take several days to resolve.  °6. It is common to experience some constipation if taking pain medication after surgery.  Increasing fluid intake and taking a stool softener (such as Colace) will usually help or prevent this problem from occurring.  A mild laxative (Milk of Magnesia or Miralax) should be taken according to package directions if there are no bowel movements after 48 hours. °7. Unless discharge instructions indicate otherwise, you may remove your bandages 24-48 hours after surgery, and you may shower at that time.  You will have steri-strips (small skin tapes) in place directly over the incision.  These strips should be left on the skin for 7-10 days. °8. ACTIVITIES:  You may resume regular (light) daily activities  beginning the next day--such as daily self-care, walking, climbing stairs--gradually increasing activities as tolerated.  You may have sexual intercourse when it is comfortable.  Refrain from any heavy lifting or straining until approved by your doctor. °a. You may drive when you are no longer taking prescription pain medication, you can comfortably wear a seatbelt, and you can safely maneuver your car and apply brakes. °b. RETURN TO WORK:  2-3 weeks with light duty - no lifting over 15 lbs. °9. You should see your doctor in the office for a follow-up appointment approximately 2-3 weeks after your surgery.  Make sure that you call for this appointment within a day or two after you arrive home to insure a convenient appointment time. °10. OTHER INSTRUCTIONS:  __________________________________________________________________________________________________________________________________________________________________________________________  °WHEN TO CALL YOUR DOCTOR: °1. Fever over 101.0 °2. Inability to urinate °3. Nausea and/or vomiting °4. Extreme swelling or bruising °5. Continued bleeding from incision. °6. Increased pain, redness, or drainage from the incision ° °The clinic staff is available to answer your questions during regular business hours.  Please don’t hesitate to call and ask to speak to one of the nurses for clinical concerns.  If you have a medical emergency, go to the nearest emergency room or call 911.  A surgeon from [facility] is always on call at the hospital ° ° °[address], [address], [place], [place]  [address] ? ° [address] [address], [place], [place]   [address] °[phone]    [phone]    FAX [phone] °Web site: [email or web address] ° ° °

## 2015-06-14 NOTE — Anesthesia Preprocedure Evaluation (Signed)
Anesthesia Evaluation  Patient identified by MRN, date of birth, ID band Patient awake    Reviewed: Allergy & Precautions, NPO status , Patient's Chart, lab work & pertinent test results  History of Anesthesia Complications Negative for: history of anesthetic complications  Airway Mallampati: II  TM Distance: >3 FB Neck ROM: Full    Dental  (+) Teeth Intact   Pulmonary neg shortness of breath, neg sleep apnea, neg COPD, neg recent URI, former smoker,    breath sounds clear to auscultation       Cardiovascular negative cardio ROS   Rhythm:Regular     Neuro/Psych negative neurological ROS     GI/Hepatic negative GI ROS, Neg liver ROS,   Endo/Other  Morbid obesity  Renal/GU negative Renal ROS     Musculoskeletal   Abdominal   Peds  Hematology negative hematology ROS (+)   Anesthesia Other Findings   Reproductive/Obstetrics                             Anesthesia Physical Anesthesia Plan  ASA: II  Anesthesia Plan: General   Post-op Pain Management:    Induction: Intravenous  Airway Management Planned: Oral ETT  Additional Equipment: None  Intra-op Plan:   Post-operative Plan: Extubation in OR  Informed Consent: I have reviewed the patients History and Physical, chart, labs and discussed the procedure including the risks, benefits and alternatives for the proposed anesthesia with the patient or authorized representative who has indicated his/her understanding and acceptance.   Dental advisory given  Plan Discussed with: CRNA and Surgeon  Anesthesia Plan Comments:         Anesthesia Quick Evaluation

## 2015-06-14 NOTE — Op Note (Signed)
Laparoscopic-assisted Ventral Hernia Repair Procedure Note  Indications: Symptomatic ventral hernia - epigastrium  Pre-operative Diagnosis: Epigastric ventral hernia  Post-operative Diagnosis: Epigastric ventral hernia x 2  Surgeon: Manus RuddSUEI,Markevion Lattin K.   Assistants: none  Anesthesia: General endotracheal anesthesia  ASA Class: 1  Procedure Details  The patient was seen in the Holding Room. The risks, benefits, complications, treatment options, and expected outcomes were discussed with the patient. The possibilities of reaction to medication, pulmonary aspiration, perforation of viscus, bleeding, recurrent infection, the need for additional procedures, failure to diagnose a condition, and creating a complication requiring transfusion or operation were discussed with the patient. The patient concurred with the proposed plan, giving informed consent.  The site of surgery properly noted/marked. The patient was taken to the operating room, identified as Sean Knight and the procedure verified as laparoscopic ventral hernia repair with mesh. A Time Out was held and the above information confirmed.    The patient was placed supine.  After establishing general anesthesia, a Foley catheter was placed.  The abdomen was prepped with Chloraprep and draped in standard fashion.  A 5 mm Optiview was used the cannulate the peritoneal cavity in the left upper quadrant below the costal margin.  Pneumoperitoneum was obtained by insufflating CO2, maintaining a maximum pressure of 15 mmHg.  The 5 mm 30-degree laparoscope was inserted.  There were some omental adhesions to the anterior abdominal wall in and around the hernia defect.  An 11-mm port was placed in the left anterior axillary line at the level of the umbilicus.  Another 5-mm port was placed in the left lower quadrant.  Harmonic scalpel and gentle traction were used to dissect the omental adhesions away from the anterior abdominal wall.  We cleared the  entire abdominal wall and were able to visualize a single fascial defect in the epigastrium measuring only about 1 cm wide.  The previous umbilical hernia repair seemed to be intact.     Since the hernia defect was so small, I made the decision to repair it in open fashion.  We made a vertical incision over the hernia.  Dissection was carried down to the hernia sac with cautery.  We dissected bluntly around the hernia sac down to the edge of the fascial defect.  The herniated preperitoneal fat was removed with the harmonic scalpel. The fascial defect measured 9 mm.  We cleared the fascia in all directions.  An adjacent 2 mm fascial defect was identified.  We connected both defect to create a single 1.5 cm defect.  A 4.3 cm Ventralex mesh was inserted into the pre-peritoneal space and was deployed.  The mesh was secured with four trans-fascial sutures of 0 Novofil.  The fascial defect was closed with multiple interrupted figure-of-eight 1 Novofil sutures.  4-0 Monocryl was used to close the skin.    We reinsufflated CO2 and inspected the hernia repair.  The mesh was laying flat and there was no sign of other hernia defects.  The fascial defect at the 11 mm port site was closed with a 0 Vicryl using the Endoclose device.  Pneumoperitoneum was then released as we removed the remainder of the trocars.  The port sites were closed with 4-0 Monocryl.  All of the incisions and stay suture sites were then sealed with Dermabond.  An abdominal binder was placed around the patient's abdomen.  The patient was extubated and brought to the recovery room in stable condition.  All sponge, instrument, and needle counts were correct prior  to closure and at the conclusion of the case.   Findings: 9 mmm defect with adjacent 2 mm defect  Estimated Blood Loss:  Minimal         Complications:  None; patient tolerated the procedure well.         Disposition: PACU - hemodynamically stable.         Condition:  stable  [name]. [name], MD, [facility] [place] [facility]  General/ Trauma Surgery  06/14/2015 3:39 PM

## 2015-06-14 NOTE — Anesthesia Procedure Notes (Signed)
Procedure Name: Intubation Date/Time: 06/14/2015 2:19 PM Performed by: Wilder GladeWINN, Amado Andal G Pre-anesthesia Checklist: Patient identified, Emergency Drugs available, Suction available, Patient being monitored and Timeout performed Patient Re-evaluated:Patient Re-evaluated prior to inductionOxygen Delivery Method: Circle system utilized Preoxygenation: Pre-oxygenation with 100% oxygen Intubation Type: IV induction Ventilation: Mask ventilation without difficulty Laryngoscope Size: Miller and 2 Grade View: Grade I Tube type: Oral Tube size: 7.5 mm Number of attempts: 1 Placement Confirmation: ETT inserted through vocal cords under direct vision,  positive ETCO2 and breath sounds checked- equal and bilateral Secured at: 23 cm Tube secured with: Tape Dental Injury: Teeth and Oropharynx as per pre-operative assessment

## 2015-06-15 ENCOUNTER — Encounter (HOSPITAL_COMMUNITY): Payer: Self-pay | Admitting: Surgery

## 2015-06-27 ENCOUNTER — Encounter (HOSPITAL_COMMUNITY): Payer: Self-pay | Admitting: Surgery

## 2015-06-27 NOTE — OR Nursing (Signed)
Addendum created to reflect correct out of recovery time

## 2015-08-18 ENCOUNTER — Encounter (HOSPITAL_COMMUNITY): Payer: Self-pay

## 2016-08-19 DIAGNOSIS — M7551 Bursitis of right shoulder: Secondary | ICD-10-CM | POA: Insufficient documentation

## 2017-05-16 ENCOUNTER — Encounter: Payer: Self-pay | Admitting: Emergency Medicine

## 2017-09-27 ENCOUNTER — Encounter: Payer: Self-pay | Admitting: Physician Assistant

## 2017-09-27 ENCOUNTER — Ambulatory Visit (INDEPENDENT_AMBULATORY_CARE_PROVIDER_SITE_OTHER): Payer: 59 | Admitting: Physician Assistant

## 2017-09-27 VITALS — BP 136/93 | HR 74 | Ht 73.0 in | Wt 246.0 lb

## 2017-09-27 DIAGNOSIS — Z23 Encounter for immunization: Secondary | ICD-10-CM

## 2017-09-27 DIAGNOSIS — J301 Allergic rhinitis due to pollen: Secondary | ICD-10-CM

## 2017-09-27 DIAGNOSIS — R030 Elevated blood-pressure reading, without diagnosis of hypertension: Secondary | ICD-10-CM

## 2017-09-27 DIAGNOSIS — F640 Transsexualism: Secondary | ICD-10-CM | POA: Insufficient documentation

## 2017-09-27 DIAGNOSIS — Z8709 Personal history of other diseases of the respiratory system: Secondary | ICD-10-CM | POA: Diagnosis not present

## 2017-09-27 DIAGNOSIS — H6982 Other specified disorders of Eustachian tube, left ear: Secondary | ICD-10-CM

## 2017-09-27 DIAGNOSIS — Z7689 Persons encountering health services in other specified circumstances: Secondary | ICD-10-CM | POA: Diagnosis not present

## 2017-09-27 DIAGNOSIS — Z9109 Other allergy status, other than to drugs and biological substances: Secondary | ICD-10-CM

## 2017-09-27 NOTE — Progress Notes (Signed)
HPI:                                                                Sean Knight is a 37 y.o. male who presents to [facility]: [facility] Care [facility] today to establish care  Current concerns: allergies, gender issues  Sean is a 37 yo AMAB who identifies as a transgender male. They present today with their wife of 11 years, Sean Knight. They are in the early stages of social transition; they have come out to their partner and healthcare providers, they occasionally go out in public presenting as a male, and they have chosen the name Sean Knight. They are still figuring out how to transition with their 2 children. Children understand that their dad occasionally has male gender expression. For now, they would like me to call them Sean. They are currently following with Bennie Pierini at [facility] for their gender dysphoria.   They complain of nasal congestion, sinus pressure and ear fullness. This is a recurrent problem. They report a history of asthma and environmental allergies. They do not currently take any medications for this. Denies fever, chills, malaise, cough, dyspnea or wheezing.   Depression screen [facility] 2/9 09/27/2017  Decreased Interest 0  Down, Depressed, Hopeless 0  PHQ - 2 Score 0    GAD 7 : Generalized Anxiety Score 09/27/2017  Nervous, Anxious, on Edge 2  Control/stop worrying 1  Worry too much - different things 1  Trouble relaxing 0  Restless 0  Easily annoyed or irritable 0  Afraid - awful might happen 1  Total GAD 7 Score 5      Past Medical History:  Diagnosis Date  . Allergy   . Asthma    as a child  . Complication of anesthesia    slow to wake up  . Weakness    left hand   Past Surgical History:  Procedure Laterality Date  . HAND SURGERY    . HERNIA REPAIR    . INSERTION OF MESH N/A 06/14/2015   Procedure: INSERTION OF MESH;  Surgeon: Manus Rudd, MD;  Location: [facility] OR;  Service: General;  Laterality:  N/A;  . LAPAROSCOPIC ASSISTED VENTRAL HERNIA REPAIR N/A 06/14/2015   Procedure: LAPAROSCOPIC ASSISTED VENTRAL HERNIA REPAIR;  Surgeon: Manus Rudd, MD;  Location: [facility] OR;  Service: General;  Laterality: N/A;   Social History   Tobacco Use  . Smoking status: Former Smoker    Types: Cigarettes    Last attempt to quit: 10/02/2002    Years since quitting: 14.9  . Smokeless tobacco: Former Neurosurgeon    Types: Chew    Quit date: 09/13/2017  Substance Use Topics  . Alcohol use: Yes    Comment: rarely   family history includes Heart disease in his maternal grandfather and maternal grandmother; Hyperlipidemia in his father; Hypertension in his father and mother; Mental illness in his mother.    ROS: Review of Systems  HENT: Positive for congestion and hearing loss. Negative for sinus pain and sore throat.   Neurological: Positive for dizziness.  All other systems reviewed and are negative.    Medications: No current outpatient medications on file.   No current facility-administered medications for this visit.    No Known Allergies  Objective:  BP (!) 136/93   Pulse 74   Ht 6\' 1"  (1.854 m)   Wt 246 lb (111.6 kg)   BMI 32.46 kg/m  Gen:  alert, not ill-appearing, no distress, appropriate for age, obese adult HEENT: head normocephalic without obvious abnormality, conjunctiva and cornea clear, left TM with air-fluid level, nasal mucosa edematous, oropharynx clear, no frontal or maxillary sinus tenderness, neck supple, no cervical adenopathy, trachea midline Pulm: Normal work of breathing, normal phonation, clear to auscultation bilaterally, no wheezes, rales or rhonchi CV: Normal rate, regular rhythm, s1 and s2 distinct, no murmurs, clicks or rubs  Neuro: alert and oriented x 3, no tremor MSK: extremities atraumatic, normal gait and station Skin: intact, no rashes on exposed skin, no jaundice, no cyanosis Psych: well-groomed, cooperative, good eye contact, euthymic mood, affect  mood-congruent, speech is articulate, and thought processes clear and goal-directed    No results found for this or any previous visit (from the past 72 hour(s)). No results found.    Assessment and Plan: 37 y.o. male with   .Diagnoses and all orders for this visit:  Encounter to establish care  Environmental allergies  History of asthma  Elevated blood pressure reading  Seasonal allergic rhinitis due to pollen  Need for immunization against influenza -     Flu Vaccine QUAD 36+ mos IM  Gender identity disorder in adult    - Personally reviewed PMH, PSH, PFH, medications, allergies, HM - Personally reviewed CBC, CMP, CT Head report from [facility] dated July 2019 - Age-appropriate cancer screening: n/a - Influenza given today - Tdap UTD per patient - PHQ2 negative  Sean has demonstrated a history of gender dysphoria and is under the care of Bennie Pierini at [facility] in [place]. They are in the stages of social transition and interested in medical transition in the future. Sean was provided with informed consent document for feminizing hormone therapy to review. He will follow-up when he is ready for medical transition.   Allergic rhinitis/ETD - advised to start generic 2G antihistamine and intranasal steroid - f/u in 3 months  Patient education and anticipatory guidance given Patient agrees with treatment plan Follow-up as needed if symptoms worsen or fail to improve  Levonne Hubert PA-C

## 2017-09-27 NOTE — Patient Instructions (Addendum)
-   start daily antihistamine such as generic Allegra (Fexofenadine) or Zyrtec (Cetirizine) - start intranasal steroid spray daily such as Flonase (Fluticasone) or Nasonex (Mometasone)    Nasal Allergies Nasal allergies are a reaction to allergens in the air. Allergens are particles in the air that cause your body to have an allergic reaction. Nasal allergies are not passed from person to person (are not contagious). They cannot be cured, but they can be controlled. What are the causes? Seasonal nasal allergies (hay fever) are caused by pollen allergens that come from grasses, trees, and weeds. Year-round nasal allergies (perennial allergic rhinitis) are caused by allergens such as house dust mites, pet dander, and mold spores. What increases the risk? The following factors may make you more likely to develop this condition:  Having certain health conditions. These include: ? Other types of allergies, such as food allergies. ? Asthma. ? Eczema.  Having a close relative who has allergies or asthma.  Exposure to house dust, pollen, dander, or other allergens at home or at work.  Exposure to air pollution or secondhand smoke when you were a child.  What are the signs or symptoms? Symptoms of this condition include:  Sneezing.  Runny nose or stuffy nose (congestion).  Watery (tearing) eyes.  Itchy eyes, nose, mouth, throat, skin, or other area.  Sore throat.  Headache.  Decreased sense of smell or taste.  Fatigue. This may occur if you have trouble sleeping due to allergies.  Swollen eyelids.  How is this diagnosed? This condition is diagnosed with a medical history and physical exam. Allergy testing may be done to determine exactly what triggers your nasal allergies. How is this treated? There is no cure for nasal allergies. Treatment focuses on controlling your symptoms, and it may include:  Medicines that block allergy symptoms. These may include allergy shots, nasal  sprays, and oral antihistamines.  Avoiding the allergen.  Follow these instructions at home:  Avoid the allergen that is causing your symptoms, if possible.  Keep windows closed. If possible, use air conditioning when pollen counts are high.  Do not use fans in your home.  Do not hang clothes outside to dry.  Wear sunglasses to keep pollen out of your eyes.  Wash your hands right away after you touch household pets.  Take over-the-counter and prescription medicines only as told by your health care provider.  Keep all follow-up visits as told by your health care provider. This is important. Contact a health care provider if:  You have a fever.  You develop a cough that does not go away (is persistent).  You start to wheeze.  Your symptoms do not improve with treatment.  You have thick nasal discharge.  You start to have nosebleeds. Get help right away if:  Your tongue or your lips are swollen.  You have trouble breathing.  You feel light-headed or you feel like you are going to faint.  You have cold sweats. This information is not intended to replace advice given to you by your health care provider. Make sure you discuss any questions you have with your health care provider. Document Released: 12/18/2004 Document Revised: 05/23/2015 Document Reviewed: 06/30/2014 Elsevier Interactive Patient Education  [employer].

## 2017-10-02 ENCOUNTER — Encounter: Payer: Self-pay | Admitting: Physician Assistant

## 2017-10-07 ENCOUNTER — Encounter: Payer: Self-pay | Admitting: Physician Assistant

## 2017-10-07 DIAGNOSIS — H6982 Other specified disorders of Eustachian tube, left ear: Secondary | ICD-10-CM | POA: Insufficient documentation

## 2017-10-07 DIAGNOSIS — H6992 Unspecified Eustachian tube disorder, left ear: Secondary | ICD-10-CM | POA: Insufficient documentation

## 2017-10-17 ENCOUNTER — Encounter: Payer: Self-pay | Admitting: Physician Assistant

## 2017-10-18 ENCOUNTER — Encounter: Payer: Self-pay | Admitting: Physician Assistant

## 2017-10-18 ENCOUNTER — Ambulatory Visit (INDEPENDENT_AMBULATORY_CARE_PROVIDER_SITE_OTHER): Payer: 59 | Admitting: Physician Assistant

## 2017-10-18 VITALS — BP 142/86 | HR 80 | Wt 253.0 lb

## 2017-10-18 DIAGNOSIS — Z13220 Encounter for screening for lipoid disorders: Secondary | ICD-10-CM | POA: Diagnosis not present

## 2017-10-18 DIAGNOSIS — E349 Endocrine disorder, unspecified: Secondary | ICD-10-CM | POA: Diagnosis not present

## 2017-10-18 DIAGNOSIS — Z130 Encounter for screening for diseases of the blood and blood-forming organs and certain disorders involving the immune mechanism: Secondary | ICD-10-CM | POA: Diagnosis not present

## 2017-10-18 DIAGNOSIS — F640 Transsexualism: Secondary | ICD-10-CM

## 2017-10-18 DIAGNOSIS — IMO0001 Reserved for inherently not codable concepts without codable children: Secondary | ICD-10-CM

## 2017-10-18 DIAGNOSIS — Z131 Encounter for screening for diabetes mellitus: Secondary | ICD-10-CM

## 2017-10-18 MED ORDER — SPIRONOLACTONE 50 MG PO TABS: 50.0000 mg | ORAL_TABLET | Freq: Every day | ORAL | 2 refills | Status: DC

## 2017-10-18 MED ORDER — ESTRADIOL 2 MG PO TABS: 2.0000 mg | ORAL_TABLET | Freq: Two times a day (BID) | ORAL | 2 refills | Status: DC

## 2017-10-18 NOTE — Progress Notes (Signed)
HPI:                                                                Sean Knight is a 37 y.o. adult who presents to [facility]: [facility] Care [facility] today for hormone therapy  Sean Knight is a pleasant 37 yo transgender male, AMAB. They present today to begin medical transition. They have reviewed the informed consent document for feminizing hormone therapy as well as completed thorough research on hormone therapy on their own and wish to proceed.  They are in the early stages of social transition; they have come out to their partner and healthcare providers, they occasionally go out in public presenting as a male, and they have chosen the name Sean Knight. They are still figuring out how to transition with their 2 children. Children understand that their dad occasionally has male gender expression.  Sean Knight and her wife have decided they do not want any additional biological children. Does not wish to freeze sperm or consult with fertility. Wife has had a tubal ligation.   They are currently following with Bennie Pierini at [facility] for their gender dysphoria.   Depression screen [facility] 2/9 09/27/2017  Decreased Interest 0  Down, Depressed, Hopeless 0  PHQ - 2 Score 0    GAD 7 : Generalized Anxiety Score 09/27/2017  Nervous, Anxious, on Edge 2  Control/stop worrying 1  Worry too much - different things 1  Trouble relaxing 0  Restless 0  Easily annoyed or irritable 0  Afraid - awful might happen 1  Total GAD 7 Score 5      Past Medical History:  Diagnosis Date  . Allergy   . Asthma    as a child  . Complication of anesthesia    slow to wake up  . Weakness    left hand   Past Surgical History:  Procedure Laterality Date  . HAND SURGERY    . HERNIA REPAIR    . INSERTION OF MESH N/A 06/14/2015   Procedure: INSERTION OF MESH;  Surgeon: Manus Rudd, MD;  Location: [facility] OR;  Service: General;  Laterality: N/A;  . LAPAROSCOPIC  ASSISTED VENTRAL HERNIA REPAIR N/A 06/14/2015   Procedure: LAPAROSCOPIC ASSISTED VENTRAL HERNIA REPAIR;  Surgeon: Manus Rudd, MD;  Location: [facility] OR;  Service: General;  Laterality: N/A;   Social History   Tobacco Use  . Smoking status: Former Smoker    Types: Cigarettes    Last attempt to quit: 10/02/2002    Years since quitting: 15.0  . Smokeless tobacco: Former Neurosurgeon    Types: Chew    Quit date: 09/13/2017  Substance Use Topics  . Alcohol use: Yes    Comment: rarely   family history includes Heart disease in her maternal grandfather and maternal grandmother; Hyperlipidemia in her father; Hypertension in her father and mother; Mental illness in her mother.    ROS: negative except as noted in the HPI  Medications: No current outpatient medications on file.   No current facility-administered medications for this visit.    No Known Allergies     Objective:  BP (!) 142/86   Pulse 80   Wt 253 lb (114.8 kg)   BMI 33.38 kg/m  Gen:  alert, not ill-appearing, no  distress, appropriate for age, obese adult HEENT: head normocephalic without obvious abnormality, conjunctiva and cornea clear, trachea midline Pulm: Normal work of breathing, normal phonation, clear to auscultation bilaterally, no wheezes, rales or rhonchi CV: Normal rate, regular rhythm, s1 and s2 distinct, no murmurs, clicks or rubs  Neuro: alert and oriented x 3, no tremor MSK: extremities atraumatic, normal gait and station Skin: intact, no rashes on exposed skin, no jaundice, no cyanosis Psych: well-groomed, cooperative, good eye contact, euthymic mood, affect mood-congruent, speech is articulate, and thought processes clear and goal-directed    No results found for this or any previous visit (from the past 72 hour(s)). No results found.    Assessment and Plan: 37 y.o. adult with   .Sean was seen today for follow-up.  Diagnoses and all orders for this visit:  Endocrine disorder, unspecified -      CBC -     Testosterone -     Comprehensive metabolic panel -     estradiol (ESTRACE) 2 MG tablet; Take 1 tablet (2 mg total) by mouth 2 (two) times daily. -     spironolactone (ALDACTONE) 50 MG tablet; Take 1 tablet (50 mg total) by mouth daily.  Screening for lipid disorders -     Lipid Panel w/reflex Direct LDL  Screening for diabetes mellitus -     Comprehensive metabolic panel  Screening for blood disease -     CBC -     Comprehensive metabolic panel   Patient meets diagnostic criteria for gender dysphoria according to [facility] guidelines Patient has the capacity to provide informed consent Written informed consent completed in office today (sent to scan) Risks and benefits of hormone therapy were reviewed with patient and spouse in detail and all questions were answered Patient declined referral for fertility preservation Baseline labs pending   Patient education and anticipatory guidance given Patient agrees with treatment plan Follow-up in 3 months  Levonne Hubert PA-C

## 2017-10-19 LAB — COMPREHENSIVE METABOLIC PANEL
AG RATIO: 1.8 (calc) (ref 1.0–2.5)
ALT: 29 U/L (ref 9–46)
AST: 31 U/L (ref 10–40)
Albumin: 4.4 g/dL (ref 3.6–5.1)
Alkaline phosphatase (APISO): 64 U/L (ref 40–115)
BUN: 15 mg/dL (ref 7–25)
CALCIUM: 9.4 mg/dL (ref 8.6–10.3)
CO2: 24 mmol/L (ref 20–32)
Chloride: 105 mmol/L (ref 98–110)
Creat: 1 mg/dL (ref 0.60–1.35)
GLUCOSE: 92 mg/dL (ref 65–139)
Globulin: 2.5 g/dL (calc) (ref 1.9–3.7)
Potassium: 3.9 mmol/L (ref 3.5–5.3)
SODIUM: 138 mmol/L (ref 135–146)
TOTAL PROTEIN: 6.9 g/dL (ref 6.1–8.1)
Total Bilirubin: 0.5 mg/dL (ref 0.2–1.2)

## 2017-10-19 LAB — CBC
HCT: 43.3 % (ref 38.5–50.0)
HEMOGLOBIN: 14.8 g/dL (ref 13.2–17.1)
MCH: 29 pg (ref 27.0–33.0)
MCHC: 34.2 g/dL (ref 32.0–36.0)
MCV: 84.7 fL (ref 80.0–100.0)
MPV: 10.2 fL (ref 7.5–12.5)
PLATELETS: 334 10*3/uL (ref 140–400)
RBC: 5.11 10*6/uL (ref 4.20–5.80)
RDW: 12.2 % (ref 11.0–15.0)
WBC: 8.4 10*3/uL (ref 3.8–10.8)

## 2017-10-19 LAB — LIPID PANEL W/REFLEX DIRECT LDL
Cholesterol: 205 mg/dL — ABNORMAL HIGH (ref <200)
HDL: 35 mg/dL — AB (ref >40)
LDL CHOLESTEROL (CALC): 115 mg/dL — AB
NON-HDL CHOLESTEROL (CALC): 170 mg/dL — AB (ref <130)
TRIGLYCERIDES: 388 mg/dL — AB (ref <150)
Total CHOL/HDL Ratio: 5.9 (calc) — ABNORMAL HIGH (ref <5.0)

## 2017-10-19 LAB — TESTOSTERONE: Testosterone: 228 ng/dL — ABNORMAL LOW (ref 250–827)

## 2017-10-29 ENCOUNTER — Encounter: Payer: Self-pay | Admitting: Physician Assistant

## 2017-10-30 ENCOUNTER — Encounter: Payer: Self-pay | Admitting: Physician Assistant

## 2017-11-01 ENCOUNTER — Ambulatory Visit: Payer: 59 | Admitting: Physician Assistant

## 2017-11-29 ENCOUNTER — Encounter: Payer: Self-pay | Admitting: Physician Assistant

## 2017-11-29 DIAGNOSIS — L659 Nonscarring hair loss, unspecified: Secondary | ICD-10-CM

## 2017-12-02 ENCOUNTER — Telehealth: Payer: Self-pay | Admitting: Physician Assistant

## 2017-12-02 MED ORDER — FINASTERIDE 1 MG PO TABS: 1.0000 mg | ORAL_TABLET | Freq: Every day | ORAL | 1 refills | Status: DC

## 2017-12-02 NOTE — Telephone Encounter (Signed)
Received fax from [employer] that Finasteride  requires a PA. Information has been sent to the insurance company. Awaiting determination.

## 2017-12-03 NOTE — Telephone Encounter (Signed)
Received a fax from [employer] that Finasteride has been denied since its a plan exclusion. It does not give me an option to appeal since its not on the formulary list. I placed all the information from the note and it still was denied. It does not give alternatives either. Please advise.

## 2017-12-03 NOTE — Telephone Encounter (Signed)
$  14-20 with [employer]

## 2017-12-04 NOTE — Telephone Encounter (Signed)
Left message for patient with PCP recommendations and asked patient to call back with any questions.

## 2018-01-07 ENCOUNTER — Telehealth: Payer: Self-pay | Admitting: Physician Assistant

## 2018-01-07 DIAGNOSIS — Z20828 Contact with and (suspected) exposure to other viral communicable diseases: Secondary | ICD-10-CM

## 2018-01-07 MED ORDER — OSELTAMIVIR PHOSPHATE 75 MG PO CAPS: 75.0000 mg | ORAL_CAPSULE | Freq: Every day | ORAL | 0 refills | Status: DC

## 2018-01-07 NOTE — Telephone Encounter (Signed)
Daughter diagnosed with influenza B in the office today. Prescription for Tamiflu prophylaxis sent to pharmacy.  .[name] was seen today for advice only.  Diagnoses and all orders for this visit:  Exposure to influenza -     oseltamivir (TAMIFLU) 75 MG capsule; Take 1 capsule (75 mg total) by mouth daily for 10 days.

## 2018-01-16 ENCOUNTER — Other Ambulatory Visit: Payer: Self-pay | Admitting: Physician Assistant

## 2018-01-16 ENCOUNTER — Telehealth: Payer: Self-pay

## 2018-01-16 NOTE — Telephone Encounter (Signed)
Pt called to cancel upcoming appointment for medication management and stated "I am done with that stuff". Pt reports there is no further need for medication management but would still like to keep [facility] as PCP.  Pt did not go into detail about this, but decline to make a follow up appt to discuss with provider at this time.   FYI to PCP.

## 2018-01-16 NOTE — Progress Notes (Signed)
Spoke with patient on the phone. Patient would like to discontinue all feminizing hormones. Gender identity has changed. Declined referral to speak to gender dysphoria specialist He will schedule an annual wellness exam at his earliest convenience.

## 2018-01-17 ENCOUNTER — Ambulatory Visit: Payer: 59 | Admitting: Physician Assistant

## 2018-01-18 ENCOUNTER — Encounter: Payer: Self-pay | Admitting: Physician Assistant

## 2018-01-20 ENCOUNTER — Other Ambulatory Visit: Payer: Self-pay | Admitting: Physician Assistant

## 2018-01-20 DIAGNOSIS — F4321 Adjustment disorder with depressed mood: Secondary | ICD-10-CM

## 2018-01-20 DIAGNOSIS — Z630 Problems in relationship with spouse or partner: Secondary | ICD-10-CM

## 2018-01-20 MED ORDER — SODIUM CHLORIDE 0.9 % IV SOLN
10.00 | INTRAVENOUS | Status: DC
Start: ? — End: 2018-01-20

## 2018-01-20 MED ORDER — GENERIC EXTERNAL MEDICATION
10.00 | Status: DC
Start: ? — End: 2018-01-20

## 2018-01-22 ENCOUNTER — Encounter: Payer: Self-pay | Admitting: Physician Assistant

## 2018-01-22 ENCOUNTER — Ambulatory Visit (INDEPENDENT_AMBULATORY_CARE_PROVIDER_SITE_OTHER): Payer: 59 | Admitting: Physician Assistant

## 2018-01-22 VITALS — BP 119/77 | HR 76 | Wt 238.0 lb

## 2018-01-22 DIAGNOSIS — F17200 Nicotine dependence, unspecified, uncomplicated: Secondary | ICD-10-CM | POA: Diagnosis not present

## 2018-01-22 DIAGNOSIS — R413 Other amnesia: Secondary | ICD-10-CM

## 2018-01-22 DIAGNOSIS — F1011 Alcohol abuse, in remission: Secondary | ICD-10-CM | POA: Insufficient documentation

## 2018-01-22 DIAGNOSIS — F4323 Adjustment disorder with mixed anxiety and depressed mood: Secondary | ICD-10-CM

## 2018-01-22 DIAGNOSIS — F5102 Adjustment insomnia: Secondary | ICD-10-CM | POA: Diagnosis not present

## 2018-01-22 MED ORDER — TRAZODONE HCL 50 MG PO TABS: 50.0000 mg | ORAL_TABLET | Freq: Every evening | ORAL | 1 refills | Status: DC | PRN

## 2018-01-22 MED ORDER — HYDROXYZINE HCL 25 MG PO TABS: 25.0000 mg | ORAL_TABLET | Freq: Four times a day (QID) | ORAL | 1 refills | Status: DC | PRN

## 2018-01-22 NOTE — Patient Instructions (Addendum)
Safety Plan: if having self-harm or suicidal thoughts Our Office 540-396-9385 [facility] Crisis Hotline 323-029-3643 National Suicide Hotline 1-800-SUICIDE Can present to [facility] in [place] If in immediate danger of harming yourself, go to the nearest emergency room or call 911  Counseling: - psychologytoday.com: search engine to locate local counselors - Family Services in your county offer counseling on a sliding scale (pay what you can afford) - [facility]: we can place a referral for you to see one of licensed counselors in [place], [place], or [place] - online counseling: English as a second language teacher (not covered by insurance, but affordable self-pay rates)  Other resources: Best Overall for Online Counseling: Talkspace "Talkspace's unlimited messaging and video conferencing makes it a convenient app for addressing a variety of needs."  Best Live Chat Sessions: Betterhelp "Betterhelp allows a variety of ways to contact your therapist, including live chat sessions."  Best for Couples: [facility] "[facility] specializes in couples therapy and they offer a variety of services to help address relationship issues."  Best for a Quick Consultation: Amwell "Amwell offers access to a variety of mental health professionals any time of day or night."  Best for Peer Support: 7 Cups of Tea "You can access free support from peers on 7 Cups of Tea or you can choose to pay to speak to a professional."  Best for Teens: Teen Counseling "Teens can chat, message, speak over the phone, or video conference with a therapist who has experience treating their age group."  Best for LGBTQ: [facility] "[facility] offers online therapy to individuals in the [employer] community, and their goal is to offer discreet, affordable, and accessible treatment."  Best Free Assessment: Doctor on Demand "Doctor on Demand provides access to a free assessment that can  help you determine if you should talk to someone about anxiety or depression."  Best for Access to a Psychiatrist: MDLive "MDLive provides access to psychiatrists who can prescribe and manage psychiatric medications."

## 2018-01-22 NOTE — Progress Notes (Signed)
HPI:                                                                Sean Knight is a 38 y.o. male who presents to [facility]: Primary Care Sports Medicine today for hospital follow-up   Patient was seen at [facility] ED on 01/18/18 for depressed mood and memory difficulties. States his mind "was just going," he felt restless and extremely anxious. He also was endorsing memory difficulties, specifically short-term memory loss. He feels like he's been "out of it" for the last year. Prior to going to the ED, he had cancelled his follow-up appointment with me and indicated that he no longer felt he had a gender identity disorder and was discontinuing his hormones. He also revealed that he was separated from his wife. He also had stopped attending marriage counseling at [facility] in [place].  He was evaluated in the ED and prescribed Vistaril for anxiety. He has been taking 25 mg three times daily since then. He states they "take the jitters away," but do not help his sleep. He tried 50 mg for sleep, and that was also not effective. He reports he is only able to sleep for about 4 hours nightly.  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 Reports he and his wife are trying to work things out and plan to go to a marriage counselor together.  Denies  He states he is not currently drinking alcohol. He has a history of alcohol abuse and reports he had mandatory AA meetings when he was in the [place] in his 20's. No DUI's. Denies black outs. He was drinking 12-14 beers daily for the last year. He reports he had 1 beer Friday night and has not had any alcohol since.  States he is not craving alcohol.   He rarely smokes marijuana. Last smoked 2 nights ago; prior to that he has not smoked for about 7 years.  He  is using smokeless tobacco daily.  ED Course Claudette M Rodriguez's Documentation  Sat Jan 18, 2018  1040 This is a 38 year old man who states that on Monday his wife left him and his 2 daughters. He states that since then he has had significant emotional stress. He states that he is now having memory issues and cannot remember anything that happened last year. He states he has not been drinking alcohol. He denies suicidal and homicidal ideations. He states he does not use drugs and has not been using drugs. He denies any ingestions. He is not having any other symptoms other than emotional distress. He denies chest pain or shortness of breath and abdominal pain and vomiting. And fevers. Otherwise there are no other associated signs or symptoms no other aggravating or alleviating factors.   Depression screen [facility] 2/9 01/22/2018 09/27/2017  Decreased Interest 2 0  Down, Depressed, Hopeless 2 0  PHQ - 2 Score 4 0  Altered sleeping 3 -  Tired, decreased energy 1 -  Change in appetite 3 -  Feeling bad or failure about yourself  3 -  Trouble  concentrating 1 -  Moving slowly or fidgety/restless 1 -  Suicidal thoughts 0 -  PHQ-9 Score 16 -  Difficult doing work/chores Very difficult -    GAD 7 : Generalized Anxiety Score 01/22/2018 09/27/2017  Nervous, Anxious, on Edge 3 2  Control/stop worrying 3 1  Worry too much - different things 3 1  Trouble relaxing 2 0  Restless 1 0  Easily annoyed or irritable 1 0  Afraid - awful might happen 1 1  Total GAD 7 Score 14 5  Anxiety Difficulty Very difficult -      Past Medical History:  Diagnosis Date  . Allergy   . Asthma    as a child  . Complication of anesthesia    slow to wake up  . Weakness    left hand   Past Surgical History:  Procedure Laterality Date  . HAND SURGERY    . HERNIA REPAIR    . INSERTION OF MESH N/A 06/14/2015   Procedure: INSERTION OF MESH;  Surgeon: Manus RuddMatthew Tsuei, MD;  [facility];  Service: General;   Laterality: N/A;  . LAPAROSCOPIC ASSISTED VENTRAL HERNIA REPAIR N/A 06/14/2015   Procedure: LAPAROSCOPIC ASSISTED VENTRAL HERNIA REPAIR;  Surgeon: Manus RuddMatthew Tsuei, MD;  Location: [facility] OR;  Service: General;  Laterality: N/A;   Social History   Tobacco Use  . Smoking status: Former Smoker    Types: Cigarettes    Last attempt to quit: 10/02/2002    Years since quitting: 15.3  . Smokeless tobacco: Former NeurosurgeonUser    Types: Chew    Quit date: 09/13/2017  Substance Use Topics  . Alcohol use: Yes    Comment: rarely   family history includes Anxiety disorder in his mother; Bipolar disorder in his mother; Heart disease in his maternal grandfather and maternal grandmother; Hyperlipidemia in his father; Hypertension in his father and mother; Mental illness in his maternal uncle and mother; Suicidality in his maternal uncle.    ROS: negative except as noted in the HPI  Medications: Current Outpatient Medications  Medication Sig Dispense Refill  . hydrOXYzine (ATARAX/VISTARIL) 25 MG tablet Take 1 tablet (25 mg total) by mouth every 6 (six) hours as needed for anxiety. 60 tablet 1  . traZODone (DESYREL) 50 MG tablet Take 1-1.5 tablets (50-75 mg total) by mouth at bedtime as needed for sleep. 45 tablet 1   No current facility-administered medications for this visit.    No Known Allergies     Objective:  BP 119/77   Pulse 76   Wt 238 lb (108 kg)   BMI 31.40 kg/m  Gen:  alert, not ill-appearing, no distress, appropriate for age HEENT: head normocephalic without obvious abnormality, conjunctiva and cornea clear, trachea midline Pulm: Normal work of breathing, normal phonation Neuro: alert and oriented x 3, no tremor MSK: extremities atraumatic, normal gait and station Skin: intact, no rashes on exposed skin, no jaundice, no cyanosis Psych: well-groomed, cooperative, good eye contact, depressed mood, affect mood-congruent, speech is articulate, and thought processes clear and goal-directed,  normal judgment, insight fair    No results found for this or any previous visit (from the past 72 hour(s)). No results found.    Assessment and Plan: 38 y.o. male with   .Diagnoses and all orders for this visit:  Adjustment disorder with mixed anxiety and depressed mood -     hydrOXYzine (ATARAX/VISTARIL) 25 MG tablet; Take 1 tablet (25 mg total) by mouth every 6 (six) hours as needed for anxiety. -  Ambulatory referral to Psychology  Insomnia due to psychological stress -     traZODone (DESYREL) 50 MG tablet; Take 1-1.5 tablets (50-75 mg total) by mouth at bedtime as needed for sleep. -     Ambulatory referral to Psychology  Alcohol use disorder, mild, in early remission  Nicotine dependence with current use  Acute stress reaction with disturbance of mood PHQ9=16, no acute safety issues GAD7=14 Referral placed to [facility] for counseling Vistaril has been helpful, so we will continue 25 mg bid Starting Trazodone 50-75 mg QHS prn for depressed mood/sleep Encouraged him to continue to abstain from alcohol. He declined medication to help with cravings. Also counseled to avoid marijuana as this can interact with Trazodone Safety plan and return precautions reviewed today  Patient education and anticipatory guidance given Patient agrees with treatment plan Follow-up in 1 month or sooner as needed if symptoms worsen or fail to improve  Levonne Hubert PA-C

## 2018-02-13 ENCOUNTER — Ambulatory Visit (INDEPENDENT_AMBULATORY_CARE_PROVIDER_SITE_OTHER): Payer: 59 | Admitting: Psychology

## 2018-02-13 DIAGNOSIS — F419 Anxiety disorder, unspecified: Secondary | ICD-10-CM

## 2018-02-19 ENCOUNTER — Encounter: Payer: Self-pay | Admitting: Physician Assistant

## 2018-02-19 ENCOUNTER — Ambulatory Visit (INDEPENDENT_AMBULATORY_CARE_PROVIDER_SITE_OTHER): Payer: 59 | Admitting: Physician Assistant

## 2018-02-19 VITALS — BP 123/84 | HR 82 | Wt 241.0 lb

## 2018-02-19 DIAGNOSIS — F331 Major depressive disorder, recurrent, moderate: Secondary | ICD-10-CM | POA: Diagnosis not present

## 2018-02-19 DIAGNOSIS — F4323 Adjustment disorder with mixed anxiety and depressed mood: Secondary | ICD-10-CM | POA: Diagnosis not present

## 2018-02-19 DIAGNOSIS — F411 Generalized anxiety disorder: Secondary | ICD-10-CM | POA: Insufficient documentation

## 2018-02-19 MED ORDER — HYDROXYZINE HCL 50 MG PO TABS: 50.0000 mg | ORAL_TABLET | Freq: Four times a day (QID) | ORAL | 1 refills | Status: DC | PRN

## 2018-02-19 NOTE — Patient Instructions (Addendum)
Sleep Hygiene . Limiting daytime naps to 30 minutes . Napping does not make up for inadequate nighttime sleep. However, a short nap of 20-30 minutes can help to improve mood, alertness and performance.  . Avoiding stimulants such as  caffeine and nicotine close to bedtime.  And when it comes to alcohol, moderation is key 4. While alcohol is well-known to help you fall asleep faster, too much close to bedtime can disrupt sleep in the second half of the night as the body begins to process the alcohol.    . Exercising to promote good quality sleep.  As little as 10 minutes of aerobic exercise, such as walking or cycling, can drastically improve nighttime sleep quality.  For the best night's sleep, most people should avoid strenuous workouts close to bedtime. However, the effect of intense nighttime exercise on sleep differs from person to person, so find out what works best for you.   . Steering clear of food that can be disruptive right before sleep.   Heavy or rich foods, fatty or fried meals, spicy dishes, citrus fruits, and carbonated drinks can trigger indigestion for some people. When this occurs close to bedtime, it can lead to painful heartburn that disrupts sleep. . Ensuring adequate exposure to natural light.  This is particularly important for individuals who may not venture outside frequently. Exposure to sunlight during the day, as well as darkness at night, helps to maintain a healthy sleep-wake cycle . Marland Kitchen Establishing a regular relaxing bedtime routine.  A regular nightly routine helps the body recognize that it is bedtime. This could include taking warm shower or bath, reading a book, or light stretches. When possible, try to avoid emotionally upsetting conversations and activities before attempting to sleep. . Making sure that the sleep environment is pleasant.  Mattress and pillows should be comfortable. The bedroom should be cool - between 60 and 67 degrees - for optimal sleep. Bright light  from lamps, cell phone and TV screens can make it difficult to fall asleep4, so turn those light off or adjust them when possible. Consider using blackout curtains, eye shades, ear plugs, "white noise" machines, humidifiers, fans and other devices that can make the bedroom more relaxing. . Meditation. YouTube Kristopher Glee. There are many smartphone apps as well   Safety Plan: if having self-harm or suicidal thoughts Our Office [phone] [facility] Crisis Hotline 337 272 5198 National Suicide Hotline 1-800-SUICIDE If in immediate danger of harming yourself, go to the nearest emergency room or call 911

## 2018-02-19 NOTE — Progress Notes (Signed)
HPI:                                                                Sean Knight is a 38 y.o. male who presents to [facility]: Primary Care Sports Medicine today for anxiety/depression follow-up  Patient was seen at [facility] ED on 01/18/18 for depressed mood and memory difficulties. States his mind "was just going," he felt restless and extremely anxious. He also was endorsing memory difficulties, specifically short-term memory loss. He feels like he's been "out of it" for the last year. Prior to going to the ED, he had cancelled his follow-up appointment with me and indicated that he no longer felt he had a gender identity disorder and was discontinuing his hormones. He also revealed that he was separated from his wife. He also had stopped attending marriage counseling at [facility] in [place].  He was evaluated in the ED and prescribed Vistaril for anxiety. He has been taking 25 mg three times daily since then. He states they "take the jitters away," but do not help his sleep. He tried 50 mg for sleep, and that was also not effective. He reports he is only able to sleep for about 4 hours nightly.  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 Reports he and his wife are trying to work things out and plan to go to a marriage counselor together.   He states he is not currently drinking alcohol. He has a history of alcohol abuse and reports he had mandatory AA meetings when he was in the [place] in his 20's. No DUI's. Denies black outs. He was drinking 12-14 beers daily for the last year. He reports he had 1 beer Friday night and has not had any alcohol since.  States he is not craving alcohol.  He is using smokeless tobacco daily.  Interval hx  He had his initial visit with [facility] last week. Next appt  with counselor is tomorrow. He plans to go weekly or bi-weekly Reports he is taking Vistaril 25 mg every 4-4.5 hours Reports hangover feeling with Trazodone and overall doesn't like how it makes him feel Does not feel like he is getting deep sleep. Sleep is non-restorative. Smoking marijuana nightly to help him fall asleep  States "everything is wrong in my head."   Depression screen [facility] 2/9 02/19/2018 01/22/2018 09/27/2017  Decreased Interest 1 2 0  Down, Depressed, Hopeless 2 2 0  PHQ - 2 Score 3 4 0  Altered sleeping 3 3 -  Tired, decreased energy 1 1 -  Change in appetite 2 3 -  Feeling bad or failure about yourself  3 3 -  Trouble concentrating 3 1 -  Moving slowly or fidgety/restless 1 1 -  Suicidal thoughts 1 0 -  PHQ-9 Score 17 16 -  Difficult doing work/chores - Very difficult -    GAD 7 : Generalized Anxiety Score 02/19/2018 01/22/2018 09/27/2017  Nervous, Anxious, on Edge 3 3 2   Control/stop worrying 3 3 1   Worry too much - different things 3 3 1   Trouble  relaxing 3 2 0  Restless 1 1 0  Easily annoyed or irritable 2 1 0  Afraid - awful might happen 3 1 1   Total GAD 7 Score 18 14 5   Anxiety Difficulty - Very difficult -      Past Medical History:  Diagnosis Date  . Allergy   . Asthma    as a child  . Complication of anesthesia    slow to wake up  . Weakness    left hand   Past Surgical History:  Procedure Laterality Date  . HAND SURGERY    . HERNIA REPAIR    . INSERTION OF MESH N/A 06/14/2015   Procedure: INSERTION OF MESH;  Surgeon: Manus Rudd, MD;  Location: [facility] OR;  Service: General;  Laterality: N/A;  . LAPAROSCOPIC ASSISTED VENTRAL HERNIA REPAIR N/A 06/14/2015   Procedure: LAPAROSCOPIC ASSISTED VENTRAL HERNIA REPAIR;  Surgeon: Manus Rudd, MD;  Location: [facility] OR;  Service: General;  Laterality: N/A;   Social History   Tobacco Use  . Smoking status: Former Smoker    Types: Cigarettes    Last attempt to quit: 10/02/2002    Years since quitting: 15.3   . Smokeless tobacco: Former Neurosurgeon    Types: Chew    Quit date: 09/13/2017  Substance Use Topics  . Alcohol use: Yes    Comment: rarely   family history includes Anxiety disorder in his mother; Bipolar disorder in his mother; Heart disease in his maternal grandfather and maternal grandmother; Hyperlipidemia in his father; Hypertension in his father and mother; Mental illness in his maternal uncle and mother; Suicidality in his maternal uncle.    ROS: negative except as noted in the HPI  Medications: Current Outpatient Medications  Medication Sig Dispense Refill  . hydrOXYzine (ATARAX/VISTARIL) 25 MG tablet Take 1 tablet (25 mg total) by mouth every 6 (six) hours as needed for anxiety. 60 tablet 1   No current facility-administered medications for this visit.    No Known Allergies     Objective:  BP 123/84   Pulse 82   Wt 241 lb (109.3 kg)   BMI 31.80 kg/m  Gen:  alert, not ill-appearing, no distress, appropriate for age, obese male HEENT: head normocephalic without obvious abnormality, conjunctiva and cornea clear, wearing glasses Neuro: alert and oriented x 3, no tremor MSK: extremities atraumatic, normal gait and station Psych: appearance casual, cooperative, good eye contact, depressed mood, affect mood-congruent, speech is articulate, and thought processes clear and goal-directed, normal judgment, good insight, no SI    No results found for this or any previous visit (from the past 72 hour(s)). No results found.    Assessment and Plan: 38 y.o. male with   .Diagnoses and all orders for this visit:  Moderate episode of recurrent major depressive disorder (HCC)  Adjustment disorder with mixed anxiety and depressed mood -     hydrOXYzine (ATARAX/VISTARIL) 50 MG tablet; Take 1 tablet (50 mg total) by mouth every 6 (six) hours as needed for anxiety.  GAD (generalized anxiety disorder)   PHQ9=17, no acute safety issues, endorses intermittent passive SI, verbally  contracted for safety, safety plan reviewed GAD7=18 He does not want to take an antidepressant Increasing Hydroxyzine to 50 mg every 6 hours prn Keep f/u with [facility] for counseling   Patient education and anticipatory guidance given Patient agrees with treatment plan Follow-up in 1 month or sooner as needed if symptoms worsen or fail to improve  Levonne Hubert PA-C

## 2018-02-20 ENCOUNTER — Ambulatory Visit (INDEPENDENT_AMBULATORY_CARE_PROVIDER_SITE_OTHER): Payer: 59 | Admitting: Psychology

## 2018-02-20 DIAGNOSIS — F419 Anxiety disorder, unspecified: Secondary | ICD-10-CM

## 2018-03-06 ENCOUNTER — Encounter: Payer: Self-pay | Admitting: Physician Assistant

## 2018-03-06 ENCOUNTER — Ambulatory Visit (INDEPENDENT_AMBULATORY_CARE_PROVIDER_SITE_OTHER): Payer: 59 | Admitting: Physician Assistant

## 2018-03-06 VITALS — BP 141/86 | HR 67 | Wt 235.0 lb

## 2018-03-06 DIAGNOSIS — F39 Unspecified mood [affective] disorder: Secondary | ICD-10-CM | POA: Insufficient documentation

## 2018-03-06 DIAGNOSIS — F1290 Cannabis use, unspecified, uncomplicated: Secondary | ICD-10-CM

## 2018-03-06 DIAGNOSIS — F1011 Alcohol abuse, in remission: Secondary | ICD-10-CM

## 2018-03-06 MED ORDER — QUETIAPINE FUMARATE 50 MG PO TABS: ORAL_TABLET | ORAL | 0 refills | Status: DC

## 2018-03-06 NOTE — Patient Instructions (Addendum)
Safety Plan: if having self-harm or suicidal thoughts  Our Office [phone] [facility] Crisis Hotline [phone] National Suicide Hotline 1-800-SUICIDE  Go to [facility] If in immediate danger of harming yourself, go to the nearest emergency room or call 911   Quetiapine tablets What is this medicine? QUETIAPINE (kwe TYE a peen) is an antipsychotic. It is used to treat schizophrenia and bipolar disorder, also known as manic-depression. This medicine may be used for other purposes; ask your health care provider or pharmacist if you have questions. COMMON BRAND NAME(S): Seroquel What should I tell my health care provider before I take this medicine? They need to know if you have any of these conditions: -blockage in your bowel -cataracts -constipation -dehydration -diabetes -difficulty swallowing -glaucoma -heart disease -history of breast cancer -kidney disease -liver disease -low blood counts, like low white cell, platelet, or red cell counts -low blood pressure or dizziness when standing up -Parkinson's disease -previous heart attack -prostate disease -seizures -stomach or intestine problems -suicidal thoughts, plans or attempt; a previous suicide attempt by you or a family member -thyroid disease -trouble passing urine -an unusual or allergic reaction to quetiapine, other medicines, foods, dyes, or preservatives -pregnant or trying to get pregnant -breast-feeding How should I use this medicine? Take this medicine by mouth. Swallow it with a drink of water. Follow the directions on the prescription label. If it upsets your stomach you can take it with food. Take your medicine at regular intervals. Do not take it more often than directed. Do not stop taking except on the advice of your doctor or health care professional. A special MedGuide will be given to you by the pharmacist with each prescription and refill. Be sure to read this information carefully  each time. Talk to your pediatrician regarding the use of this medicine in children. While this drug may be prescribed for children as young as 10 years for selected conditions, precautions do apply. Patients over age 4 years may have a stronger reaction to this medicine and need smaller doses. Overdosage: If you think you have taken too much of this medicine contact a poison control center or emergency room at once. NOTE: This medicine is only for you. Do not share this medicine with others. What if I miss a dose? If you miss a dose, take it as soon as you can. If it is almost time for your next dose, take only that dose. Do not take double or extra doses. What may interact with this medicine? Do not take this medicine with any of the following medications: -cisapride -dofetilide -dronedarone -fluconazole -metoclopramide -pimozide -posaconazole -thioridazine This medicine may also interact with the following medications: -alcohol -antihistamines for allergy cough and cold -antiviral medicines for HIV or AIDS -atropine -certain medicines for bladder problems like oxybutynin, tolterodine -certain medicines for blood pressure -certain medicines for depression, anxiety, or psychotic disturbances -certain medicines for diabetes -certain medicines for stomach problems like dicyclomine, hyoscyamine -certain medicines for travel sickness like scopolamine -certain medicines for Parkinson's disease -certain medicines for seizures like carbamazepine, phenobarbital, phenytoin -cimetidine -erythromycin -ipratropium -other medicines that prolong the QT interval (cause an abnormal heart rhythm) -rifampin -steroid medicines like prednisone or cortisone This list may not describe all possible interactions. Give your health care provider a list of all the medicines, herbs, non-prescription drugs, or dietary supplements you use. Also tell them if you smoke, drink alcohol, or use illegal drugs.  Some items may interact with your medicine. What should I watch for  while using this medicine? Visit your doctor or health care professional for regular checks on your progress. It may be several weeks before you see the full effects of this medicine. Your health care provider may suggest that you have your eyes examined prior to starting this medicine, and every 6 months thereafter. If you have been taking this medicine regularly for some time, do not suddenly stop taking it. You must gradually reduce the dose or your symptoms may get worse. Ask your doctor or health care professional for advice. Patients and their families should watch out for worsening depression or thoughts of suicide. Also watch out for sudden or severe changes in feelings such as feeling anxious, agitated, panicky, irritable, hostile, aggressive, impulsive, severely restless, overly excited and hyperactive, or not being able to sleep. If this happens, especially at the beginning of antidepressant treatment or after a change in dose, call your health care professional. Bonita Quin may get dizzy or drowsy. Do not drive, use machinery, or do anything that needs mental alertness until you know how this medicine affects you. Do not stand or sit up quickly, especially if you are an older patient. This reduces the risk of dizzy or fainting spells. Alcohol can increase dizziness and drowsiness. Avoid alcoholic drinks. Do not treat yourself for colds, diarrhea or allergies. Ask your doctor or health care professional for advice, some ingredients may increase possible side effects. This medicine can reduce the response of your body to heat or cold. Dress warm in cold weather and stay hydrated in hot weather. If possible, avoid extreme temperatures like saunas, hot tubs, very hot or cold showers, or activities that can cause dehydration such as vigorous exercise. What side effects may I notice from receiving this medicine? Side effects that you should  report to your doctor or health care professional as soon as possible: -allergic reactions like skin rash, itching or hives, swelling of the face, lips, or tongue -changes in vision -difficulty swallowing -elevated mood, decreased need for sleep, racing thoughts, impulsive behavior -eye pain -redness, blistering, peeling, or loosening of the skin, including inside the mouth -restlessness, pacing, inability to keep still -seizures -signs and symptoms of a dangerous change in heartbeat or heart rhythm like chest pain; dizziness; fast, irregular heartbeat; palpitations; feeling faint or lightheaded; falls; breathing problems -signs and symptoms of high blood sugar such as dizziness; dry mouth; dry skin; fruity breath; nausea; stomach pain; increased hunger; increased thirst; increased urination -signs and symptoms of hypothyroidism like fatigue; increased sensitivity to cold; weight gain; hoarseness; thinning hair -signs and symptoms of infection like fever; chills; cough; sore throat; pain or trouble passing urine -signs and symptoms of low blood pressure like dizziness; feeling faint or lightheaded; falls; unusually weak or tired -signs and symptoms of neuroleptic malignant syndrome (NMS) like confusion; fast, irregular heartbeat; high fever; increased sweating; stiff muscles -signs and symptoms of a stroke like changes in vision; confusion; trouble speaking or understanding; severe headaches; sudden numbness or weakness of the face, arm or leg; trouble walking; dizziness; loss of balance or coordination -signs and symptoms of tardive dyskinesia, like uncontrollable head, mouth, neck, arm, or leg movements -suicidal thoughts, mood changes Side effects that usually do not require medical attention (report to your doctor or health care professional if they continue or are bothersome): -change in sex drive or performance -constipation -drowsiness -dry mouth -upset stomach -weight gain This list  may not describe all possible side effects. Call your doctor for medical advice about side effects. You  may report side effects to FDA at 1-800-FDA-1088. Where should I keep my medicine? Keep out of the reach of children. Store at room temperature between 15 and 30 degrees C (59 and 86 degrees F). Throw away any unused medicine after the expiration date. NOTE: This sheet is a summary. It may not cover all possible information. If you have questions about this medicine, talk to your doctor, pharmacist, or health care provider.  2019 Elsevier/Gold Standard (2016-12-07 14:16:00)

## 2018-03-06 NOTE — Progress Notes (Signed)
HPI:                                                                Sean Knight is a 38 y.o. male who presents to [facility]: Primary Care Sports Medicine today for anxiety  Interval hx 03/06/18 Has been taking Vistaril 50 mg three times daily but still having significant anxiety He has been seeing a counselor once weekly since 02/13/18 and reports this has been helpful Reports he really needs to start medication because "I just can't turn my head off." He reports he is super irritable and lashing out at family members for no reason. He's having multiple conflicts with co-workers and family members. Mother has bipolar disorder and he is concerned that he might also have this.  02/19/2018 He had his initial visit with [facility] last week. Next appt with counselor is tomorrow. He plans to go weekly or bi-weekly Reports he is taking Vistaril 25 mg every 4-4.5 hours Reports hangover feeling with Trazodone and overall doesn't like how it makes him feel Does not feel like he is getting deep sleep. Sleep is non-restorative. Smoking marijuana nightly to help him fall asleep  States "everything is wrong in my head."  01/22/2018 Patient was seen at [facility] ED on 01/18/18 for depressed mood and memory difficulties. States his mind"was just going,"he felt restless and extremely anxious.He also was endorsing memory difficulties, specifically short-term memory loss. He feels like he's been "out of it" for the last year. Prior to going to the ED, he had cancelled his follow-up appointment with me and indicated that he no longer felt he had a gender identity disorder and was discontinuing his hormones. He also revealed that he was separated from his wife. He also had stopped attending marriage counseling at [facility] in [place].  He wasevaluated in the ED andprescribed Vistaril for anxiety.He has been taking 25 mg three times daily since then.He states they"take the  jitters away,"but do not help his sleep. He tried 50 mg for sleep, and that was also not effective. He reports he is only able to sleep for about 4 hours nightly.  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 Reports heand his wife are trying to work things out and plan to go to a marriage counselor together.   He states he is not currently drinking alcohol. He has a history of alcohol abuse and reports he had mandatory AA meetings when he was in the [place] in his 20's. No DUI's. Denies black outs. He was drinking 12-14 beers daily for the last year. Hereports he had 1 beer Friday night and has not had any alcohol since.States he is not craving alcohol.  He is using smokeless tobacco daily.  Depression screen [facility] 2/9 03/06/2018 02/19/2018 01/22/2018 09/27/2017  Decreased Interest 0  Down, Depressed, Hopeless 0  PHQ - 2 Score 0  Altered sleeping 0 3 3 -  Tired, decreased energy -  Change in appetite -  Feeling bad or failure about  yourself  3 3 3  -  Trouble concentrating 3 3 1  -  Moving slowly or fidgety/restless 1 1 1  -  Suicidal thoughts 0 1 0 -  PHQ-9 Score 12 17 16  -  Difficult doing work/chores Extremely dIfficult - Very difficult -    GAD 7 : Generalized Anxiety Score 03/06/2018 02/19/2018 01/22/2018 09/27/2017  Nervous, Anxious, on Edge 2 3 3 2   Control/stop worrying 3 3 3 1   Worry too much - different things 3 3 3 1   Trouble relaxing 3 3 2  0  Restless 2 1 1  0  Easily annoyed or irritable 1 2 1  0  Afraid - awful might happen 1 3 1 1   Total GAD 7 Score 15 18 14 5   Anxiety Difficulty Extremely difficult - Very difficult -      Past Medical History:  Diagnosis Date  . Allergy   . Asthma    as a child  . Complication of anesthesia    slow to wake up  . Weakness    left hand    Past Surgical History:  Procedure Laterality Date  . HAND SURGERY    . HERNIA REPAIR    . INSERTION OF MESH N/A 06/14/2015   Procedure: INSERTION OF MESH;  Surgeon: Manus Rudd, MD;  Location: [facility] OR;  Service: General;  Laterality: N/A;  . LAPAROSCOPIC ASSISTED VENTRAL HERNIA REPAIR N/A 06/14/2015   Procedure: LAPAROSCOPIC ASSISTED VENTRAL HERNIA REPAIR;  Surgeon: Manus Rudd, MD;  Location: [facility] OR;  Service: General;  Laterality: N/A;   Social History   Tobacco Use  . Smoking status: Former Smoker    Types: Cigarettes    Last attempt to quit: 10/02/2002    Years since quitting: 15.4  . Smokeless tobacco: Former Neurosurgeon    Types: Chew    Quit date: 09/13/2017  Substance Use Topics  . Alcohol use: Yes    Comment: rarely   family history includes Anxiety disorder in his mother; Bipolar disorder in his mother; Heart disease in his maternal grandfather and maternal grandmother; Hyperlipidemia in his father; Hypertension in his father and mother; Mental illness in his maternal uncle and mother; Suicidality in his maternal uncle.    Review of Systems  Psychiatric/Behavioral: Positive for agitation, dysphoric mood and sleep disturbance. Negative for hallucinations, self-injury and suicidal ideas. The patient is nervous/anxious.   All other systems reviewed and are negative.    Medications: Current Outpatient Medications  Medication Sig Dispense Refill  . hydrOXYzine (ATARAX/VISTARIL) 50 MG tablet Take 1 tablet (50 mg total) by mouth every 6 (six) hours as needed for anxiety. 120 tablet 1   No current facility-administered medications for this visit.    No Known Allergies     Objective:  BP (!) 141/86   Pulse 67   Wt 235 lb (106.6 kg)   BMI 31.00 kg/m  Gen:  alert, not ill-appearing, no distress, appropriate for age, obese male HEENT: head normocephalic without obvious abnormality, conjunctiva and cornea clear, wearing glasses, trachea midline Pulm: Normal work of  breathing, normal phonation Neuro: alert and oriented x 3, no tremor MSK: extremities atraumatic, normal gait and station Skin: intact, no rashes on exposed skin, no jaundice, no cyanosis Psych: appearance casual, cooperative, good eye contact, depressed mood, affect mood-congruent, mild psychomotor agitation, speech is articulate, thought processes clear and goal-directed, normal judgment, good insight    No results found for this or any previous visit (from the past 72 hour(s)). No results found.    Assessment and  Plan: 38 y.o. male with   .Diagnoses and all orders for this visit:  Mood disorder (HCC) -     Ambulatory referral to Psychiatry -     QUEtiapine (SEROQUEL) 50 MG tablet; Take 1 tablet (50 mg total) by mouth at bedtime for 7 days, THEN 2 tablets (100 mg total) at bedtime for 7 days.  PHQ9=12, no acute safety issues GAD7=15 Positive MDQ, 8 Yes answers to #1 Family hx of bipolar disorder ina first degree relative Personal hx of alcohol use disorder Referring to Psychiatry for eval and further management Starting Quetiapine 50 mg QSH, self-titrate to 100 mg after 1 week. Counseled on potential adverse effects including sedation Advised him to stop smoking marijuana Safety plan reviewed with patient and provided on AVS Keep follow-up with counselor  Patient education and anticipatory guidance given Patient agrees with treatment plan Follow-up as needed if symptoms worsen or fail to improve  Levonne Hubert PA-C

## 2018-03-11 ENCOUNTER — Ambulatory Visit: Payer: Self-pay | Admitting: Psychology

## 2018-03-11 ENCOUNTER — Encounter: Payer: Self-pay | Admitting: Physician Assistant

## 2018-03-11 ENCOUNTER — Ambulatory Visit (HOSPITAL_COMMUNITY): Payer: Self-pay | Admitting: Psychiatry

## 2018-03-11 DIAGNOSIS — F1290 Cannabis use, unspecified, uncomplicated: Secondary | ICD-10-CM | POA: Insufficient documentation

## 2018-03-11 DIAGNOSIS — F1011 Alcohol abuse, in remission: Secondary | ICD-10-CM | POA: Insufficient documentation

## 2018-03-21 ENCOUNTER — Encounter: Payer: Self-pay | Admitting: Physician Assistant

## 2018-03-21 ENCOUNTER — Other Ambulatory Visit: Payer: Self-pay

## 2018-03-21 ENCOUNTER — Ambulatory Visit (INDEPENDENT_AMBULATORY_CARE_PROVIDER_SITE_OTHER): Payer: 59 | Admitting: Physician Assistant

## 2018-03-21 VITALS — BP 113/78 | HR 65 | Wt 236.0 lb

## 2018-03-21 DIAGNOSIS — F39 Unspecified mood [affective] disorder: Secondary | ICD-10-CM

## 2018-03-21 MED ORDER — QUETIAPINE FUMARATE 50 MG PO TABS: 50.0000 mg | ORAL_TABLET | Freq: Every day | ORAL | 0 refills | Status: DC

## 2018-03-21 NOTE — Progress Notes (Signed)
HPI:                                                                Sean Knight is a 38 y.o. male who presents to [facility]: Primary Care Sports Medicine today for anxiety  03/21/2018 - started on Quetiapine 50 mg 2 weeks ago. Reports this is helping significantly in conjunction with Hydroxyzine 25 mg. Reports he is able to get through the entire day without "any meltdowns." States Quetiapine works better in the morning - missed last counseling appt due to court date - he is still smoking marijuana nightly to help him sleep  Interval hx 03/06/18 Has been taking Vistaril 50 mg three times daily but still having significant anxiety He has been seeing a counselor once weekly since 02/13/18 and reports this has been helpful Reports he really needs to start medication because "I just can't turn my head off." He reports he is super irritable and lashing out at family members for no reason. He's having multiple conflicts with co-workers and family members. Mother has bipolar disorder and he is concerned that he might also have this.  02/19/2018 He had his initial visit with [facility] last week. Next appt with counselor is tomorrow. He plans to go weekly or bi-weekly Reports he is taking Vistaril 25 mg every 4-4.5 hours Reports hangover feeling with Trazodone and overall doesn't like how it makes him feel Does not feel like he is getting deep sleep. Sleep is non-restorative. Smoking marijuana nightly to help him fall asleep  States "everything is wrong in my head."  01/22/2018 Patient was seen at [facility] ED on 01/18/18 for depressed mood and memory difficulties. States his mind"was just going,"he felt restless and extremely anxious.He also was endorsing memory difficulties, specifically short-term memory loss. He feels like he's been "out of it" for the last year. Prior to going to the ED, he had cancelled his follow-up appointment with me and indicated that he no longer felt he had a  gender identity disorder and was discontinuing his hormones. He also revealed that he was separated from his wife. He also had stopped attending marriage counseling at [facility] in [place].  He wasevaluated in the ED andprescribed Vistaril for anxiety.He has been taking 25 mg three times daily since then.He states they"take the jitters away,"but do not help his sleep. He tried 50 mg for sleep, and that was also not effective. He reports he is only able to sleep for about 4 hours nightly.  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 Reports heand his wife are trying to work things out and plan to go to a marriage counselor together.   He states he is not currently drinking alcohol. He has a history of alcohol abuse and reports he had mandatory AA meetings when he was in the [place] in his 20's. No DUI's. Denies black outs. He was drinking 12-14 beers daily for the last year. Hereports he had 1 beer Friday night and has not had any alcohol since.States he is not craving alcohol.  He is using smokeless tobacco daily.  Depression screen [facility] 2/9 03/21/2018 03/06/2018 02/19/2018 01/22/2018 09/27/2017  Decreased Interest 1 1 1 2  0  Down, Depressed, Hopeless 1 1 2 2  0  PHQ - 2 Score 2 2 3 4  0  Altered sleeping 0 0 3 3 -  Tired, decreased energy 1 1 1 1  -  Change in appetite 1 2 2 3  -  Feeling bad or failure about yourself  1 3 3 3  -  Trouble concentrating 0 3 3 1  -  Moving slowly or fidgety/restless 0 1 1 1  -  Suicidal thoughts 0 0 1 0 -  PHQ-9 Score 5 12 17 16  -  Difficult doing work/chores Somewhat difficult Extremely dIfficult - Very difficult -    GAD 7 : Generalized Anxiety Score 03/21/2018 03/06/2018 02/19/2018 01/22/2018  Nervous, Anxious, on Edge 1 2 3 3   Control/stop worrying 1 3 3 3   Worry too much -  different things 1 3 3 3   Trouble relaxing 1 3 3 2   Restless 0 2 1 1   Easily annoyed or irritable 1 1 2 1   Afraid - awful might happen 2 1 3 1   Total GAD 7 Score 7 15 18 14   Anxiety Difficulty - Extremely difficult - Very difficult      Past Medical History:  Diagnosis Date  . Allergy   . Asthma    as a child  . Complication of anesthesia    slow to wake up  . Weakness    left hand   Past Surgical History:  Procedure Laterality Date  . HAND SURGERY    . HERNIA REPAIR    . INSERTION OF MESH N/A 06/14/2015   Procedure: INSERTION OF MESH;  Surgeon: Manus Rudd, MD;  Location: [facility] OR;  Service: General;  Laterality: N/A;  . LAPAROSCOPIC ASSISTED VENTRAL HERNIA REPAIR N/A 06/14/2015   Procedure: LAPAROSCOPIC ASSISTED VENTRAL HERNIA REPAIR;  Surgeon: Manus Rudd, MD;  Location: [facility] OR;  Service: General;  Laterality: N/A;   Social History   Tobacco Use  . Smoking status: Former Smoker    Types: Cigarettes    Last attempt to quit: 10/02/2002    Years since quitting: 15.4  . Smokeless tobacco: Former Neurosurgeon    Types: Chew    Quit date: 09/13/2017  Substance Use Topics  . Alcohol use: Yes    Comment: rarely   family history includes Anxiety disorder in his mother; Bipolar disorder in his mother; Heart disease in his maternal grandfather and maternal grandmother; Hyperlipidemia in his father; Hypertension in his father and mother; Mental illness in his maternal uncle and mother; Suicidality in his maternal uncle.    Review of Systems  Psychiatric/Behavioral: Positive for agitation, dysphoric mood and sleep disturbance. Negative for hallucinations, self-injury and suicidal ideas. The patient is nervous/anxious.   All other systems reviewed and are negative.    Medications: Current Outpatient Medications  Medication Sig Dispense Refill  . hydrOXYzine (ATARAX/VISTARIL) 50 MG tablet Take 1 tablet (50 mg total) by mouth every 6 (six) hours as needed for anxiety. 120 tablet 1  .  QUEtiapine (SEROQUEL) 50 MG tablet Take 1 tablet (50 mg total) by mouth daily. 60 tablet 0   No current facility-administered medications for this visit.    No Known Allergies     Objective:  BP 113/78   Pulse 65   Wt 236 lb (107 kg)   BMI 31.14 kg/m  Gen:  alert, not ill-appearing, no distress, appropriate for age, obese male HEENT: head normocephalic without obvious  abnormality, conjunctiva and cornea clear, wearing glasses, trachea midline Pulm: Normal work of breathing, normal phonation Neuro: alert and oriented x 3, no tremor MSK: extremities atraumatic, normal gait and station Skin: intact, no rashes on exposed skin, no jaundice, no cyanosis Psych: appearance casual, cooperative, good eye contact, depressed mood, affect mood-congruent, no psychomotor agitation, speech is articulate, thought processes clear and goal-directed, normal judgment, good insight    No results found for this or any previous visit (from the past 72 hour(s)). No results found.    Assessment and Plan: 38 y.o. male with   .Sean was seen today for depression and anxiety.  Diagnoses and all orders for this visit:  Mood disorder (HCC) -     QUEtiapine (SEROQUEL) 50 MG tablet; Take 1 tablet (50 mg total) by mouth daily.  PHQ9=5, improved from 12, no acute safety issues GAD7=7, improved from 15 Partial response to Quetiapine. Positive MDQ, 8 Yes answers to #1 Family hx of bipolar disorder ina first degree relative Personal hx of alcohol use disorder Cont Quetiapine 50 mg daily (prefers QAM dosing over QHS) Advised him to stop smoking marijuana, but he reports this is the only thing that helps him sleep Safety plan reviewed with patient and provided on AVS Keep follow-up with counselor and psychiatry  Patient education and anticipatory guidance given Patient agrees with treatment plan Follow-up as needed if symptoms worsen or fail to improve  Levonne Hubert PA-C

## 2018-03-25 ENCOUNTER — Ambulatory Visit: Payer: 59 | Admitting: Psychology

## 2018-03-28 ENCOUNTER — Other Ambulatory Visit: Payer: Self-pay

## 2018-03-28 ENCOUNTER — Ambulatory Visit (HOSPITAL_COMMUNITY): Payer: Self-pay | Admitting: Psychiatry

## 2018-03-28 DIAGNOSIS — F39 Unspecified mood [affective] disorder: Secondary | ICD-10-CM

## 2018-03-28 DIAGNOSIS — F4323 Adjustment disorder with mixed anxiety and depressed mood: Secondary | ICD-10-CM

## 2018-03-28 MED ORDER — QUETIAPINE FUMARATE 50 MG PO TABS: 50.0000 mg | ORAL_TABLET | Freq: Every day | ORAL | 0 refills | Status: DC

## 2018-03-28 MED ORDER — HYDROXYZINE HCL 50 MG PO TABS: 50.0000 mg | ORAL_TABLET | Freq: Four times a day (QID) | ORAL | 1 refills | Status: DC | PRN

## 2018-03-31 ENCOUNTER — Other Ambulatory Visit: Payer: Self-pay | Admitting: Physician Assistant

## 2018-03-31 DIAGNOSIS — F39 Unspecified mood [affective] disorder: Secondary | ICD-10-CM

## 2018-04-30 ENCOUNTER — Other Ambulatory Visit: Payer: Self-pay | Admitting: Physician Assistant

## 2018-04-30 DIAGNOSIS — F39 Unspecified mood [affective] disorder: Secondary | ICD-10-CM

## 2018-05-02 ENCOUNTER — Ambulatory Visit: Payer: 59 | Admitting: Physician Assistant

## 2018-05-02 ENCOUNTER — Encounter: Payer: Self-pay | Admitting: Physician Assistant

## 2018-05-02 ENCOUNTER — Telehealth (INDEPENDENT_AMBULATORY_CARE_PROVIDER_SITE_OTHER): Payer: 59 | Admitting: Physician Assistant

## 2018-05-02 VITALS — Temp 98.2°F | Wt 231.0 lb

## 2018-05-02 DIAGNOSIS — F39 Unspecified mood [affective] disorder: Secondary | ICD-10-CM | POA: Diagnosis not present

## 2018-05-02 DIAGNOSIS — F411 Generalized anxiety disorder: Secondary | ICD-10-CM

## 2018-05-02 DIAGNOSIS — G43019 Migraine without aura, intractable, without status migrainosus: Secondary | ICD-10-CM | POA: Insufficient documentation

## 2018-05-02 MED ORDER — QUETIAPINE FUMARATE 100 MG PO TABS: 100.0000 mg | ORAL_TABLET | Freq: Two times a day (BID) | ORAL | 1 refills | Status: DC

## 2018-05-02 MED ORDER — DEXAMETHASONE 4 MG PO TABS: 8.0000 mg | ORAL_TABLET | Freq: Once | ORAL | 0 refills | Status: AC

## 2018-05-02 MED ORDER — RIZATRIPTAN BENZOATE 10 MG PO TABS: 10.0000 mg | ORAL_TABLET | ORAL | 0 refills | Status: DC | PRN

## 2018-05-02 NOTE — Progress Notes (Signed)
Virtual Visit via Video Note  I connected with Sean Knight on 05/02/18 at  4:20 PM EDT by a video enabled telemedicine application and verified that I am speaking with the correct person using two identifiers.   I discussed the limitations of evaluation and management by telemedicine and the availability of in person appointments. The patient expressed understanding and agreed to proceed.  History of Present Illness: HPI:                                                                Sean Knight is a 38 y.o. male   CC: anxiety, headache  Patient reports his psychiatric medications are no longer effective.  He has been taking quetiapine 50 mg twice a day for approximately 6 weeks.  He also is taking 50 mg of hydroxyzine scheduled 3 times per day.  He was referred to psychiatry but did not schedule a new patient appointment.  He also was lost to follow-up with his counselor.  Reports he had severe anxiety last night with repetitive, intrusive thoughts and feeling agitated.  He states he took 1 of his wife's Xanax and this helped with his physical symptoms, but did nothing for his repetitive thoughts.  States he has been having "screaming headaches" in the occipital area. Associated with nausea and phonophobia. No vision change or aura, no focal weakness or gait disturbance. For the last 4 days he has been having a continuous headache, waxing and waning in severity. He took Aleve cold and sinus without any relief. He had a negative CT Head in July 2019.   Depression screen [facility] 2/9 05/02/2018 03/21/2018 03/06/2018 02/19/2018 01/22/2018  Decreased Interest 2 1 1 1 2   Down, Depressed, Hopeless 2 1 1 2 2   PHQ - 2 Score 4 2 2 3 4   Altered sleeping 1 0 0 3 3  Tired, decreased energy 1 1 1 1 1   Change in appetite 2 1 2 2 3   Feeling bad or failure about yourself  2 1 3 3 3   Trouble concentrating 3 0 3 3 1   Moving slowly or fidgety/restless 0 0 1 1 1   Suicidal thoughts 0 0 0 1 0  PHQ-9 Score  13 5 12 17 16   Difficult doing work/chores - Somewhat difficult Extremely dIfficult - Very difficult    GAD 7 : Generalized Anxiety Score 05/02/2018 03/21/2018 03/06/2018 02/19/2018  Nervous, Anxious, on Edge 3 1 2 3   Control/stop worrying 3 1 3 3   Worry too much - different things 3 1 3 3   Trouble relaxing 2 1 3 3   Restless 3 0 2 1  Easily annoyed or irritable 3 1 1 2   Afraid - awful might happen 0 2 1 3   Total GAD 7 Score 17 7 15 18   Anxiety Difficulty - - Extremely difficult -      Past Medical History:  Diagnosis Date  . Allergy   . Asthma    as a child  . Complication of anesthesia    slow to wake up  . Weakness    left hand   Past Surgical History:  Procedure Laterality Date  . HAND SURGERY    . HERNIA REPAIR    . INSERTION OF MESH N/A 06/14/2015   Procedure:  INSERTION OF MESH;  Surgeon: Manus Rudd, MD;  Location: [facility] OR;  Service: General;  Laterality: N/A;  . LAPAROSCOPIC ASSISTED VENTRAL HERNIA REPAIR N/A 06/14/2015   Procedure: LAPAROSCOPIC ASSISTED VENTRAL HERNIA REPAIR;  Surgeon: Manus Rudd, MD;  Location: [facility] OR;  Service: General;  Laterality: N/A;   Social History   Tobacco Use  . Smoking status: Former Smoker    Types: Cigarettes    Last attempt to quit: 10/02/2002    Years since quitting: 15.5  . Smokeless tobacco: Former Neurosurgeon    Types: Chew    Quit date: 09/13/2017  Substance Use Topics  . Alcohol use: Yes    Comment: rarely   family history includes Anxiety disorder in his mother; Bipolar disorder in his mother; Heart disease in his maternal grandfather and maternal grandmother; Hyperlipidemia in his father; Hypertension in his father and mother; Mental illness in his maternal uncle and mother; Suicidality in his maternal uncle.    ROS: negative except as noted in the HPI  Medications: Current Outpatient Medications  Medication Sig Dispense Refill  . hydrOXYzine (ATARAX/VISTARIL) 50 MG tablet Take 1 tablet (50 mg total) by mouth every 6 (six)  hours as needed for anxiety. 120 tablet 1  . QUEtiapine (SEROQUEL) 50 MG tablet Take 2 tablets (100 mg total) by mouth at bedtime. 180 tablet 0   No current facility-administered medications for this visit.    No Known Allergies     Objective:  Temp 98.2 F (36.8 C) (Oral)   Wt 231 lb (104.8 kg)   BMI 30.48 kg/m  Gen:  alert, not ill-appearing, no distress, appropriate for age HEENT: head normocephalic without obvious abnormality, conjunctiva and cornea clear, wearing glasses, trachea midline Pulm: Normal work of breathing, normal phonation, clear to auscultation bilaterally Neuro: alert and oriented x 3 Psych: cooperative, depressed mood, affect mood-congruent, appears anxious, speech is articulate, normal rate and volume; thought processes clear and goal-directed, normal judgment, good insight, no SI    Assessment and Plan: 38 y.o. male with   .Diagnoses and all orders for this visit:  Mood disorder (HCC) -     QUEtiapine (SEROQUEL) 100 MG tablet; Take 1 tablet (100 mg total) by mouth 2 (two) times a day.  GAD (generalized anxiety disorder)  Intractable migraine without aura and without status migrainosus -     rizatriptan (MAXALT) 10 MG tablet; Take 1 tablet (10 mg total) by mouth as needed for migraine. May repeat in 2 hours if needed -     dexamethasone (DECADRON) 4 MG tablet; Take 2 tablets (8 mg total) by mouth once for 1 dose.   Mood disorder, GAD PHQ 9 equals 13, no acute safety issues Gad 7 equals 17 Safety plan reviewed with patient.  Encouraged him to go to [facility] or call the crisis line over the weekend if symptoms worsen Increasing quetiapine 200 mg twice a day Continue hydroxyzine 50 mg 3 times daily I have routed a message to his therapist to reach out to him and schedule a follow-up appointment Ultimately I still think Sean will need to be under the care of a psychiatrist, however due to COVID-19 there are access  issues Follow-up in 1 week  Intractable headache Migrainous features, unable to perform neuro exam due to limitations of e-visit No red flags Prior negative CT head Treating with Rizatriptan and Decadron ED precautions discussed  Follow Up Instructions:    I discussed the assessment and treatment plan with the patient. The patient  was provided an opportunity to ask questions and all were answered. The patient agreed with the plan and demonstrated an understanding of the instructions.   The patient was advised to call back or seek an in-person evaluation if the symptoms worsen or if the condition fails to improve as anticipated.  I provided 25 minutes of non-face-to-face time during this encounter.   Carlis Stableharley Elizabeth Shelle Galdamez, [place]

## 2018-05-02 NOTE — Telephone Encounter (Signed)
Called Pt and scheduled for visit with PCP today

## 2018-05-05 ENCOUNTER — Encounter: Payer: Self-pay | Admitting: Physician Assistant

## 2018-05-06 ENCOUNTER — Encounter: Payer: Self-pay | Admitting: Physician Assistant

## 2018-05-07 ENCOUNTER — Ambulatory Visit (INDEPENDENT_AMBULATORY_CARE_PROVIDER_SITE_OTHER): Payer: 59 | Admitting: Psychology

## 2018-05-07 DIAGNOSIS — F419 Anxiety disorder, unspecified: Secondary | ICD-10-CM | POA: Diagnosis not present

## 2018-05-13 ENCOUNTER — Ambulatory Visit (INDEPENDENT_AMBULATORY_CARE_PROVIDER_SITE_OTHER): Payer: 59 | Admitting: Psychology

## 2018-05-13 DIAGNOSIS — F419 Anxiety disorder, unspecified: Secondary | ICD-10-CM | POA: Diagnosis not present

## 2018-05-22 ENCOUNTER — Ambulatory Visit (INDEPENDENT_AMBULATORY_CARE_PROVIDER_SITE_OTHER): Payer: 59 | Admitting: Psychology

## 2018-05-22 ENCOUNTER — Encounter: Payer: Self-pay | Admitting: Physician Assistant

## 2018-05-22 DIAGNOSIS — F419 Anxiety disorder, unspecified: Secondary | ICD-10-CM

## 2018-05-22 DIAGNOSIS — G43019 Migraine without aura, intractable, without status migrainosus: Secondary | ICD-10-CM

## 2018-05-23 MED ORDER — RIZATRIPTAN BENZOATE 10 MG PO TABS: 10.0000 mg | ORAL_TABLET | ORAL | 1 refills | Status: DC | PRN

## 2018-06-04 ENCOUNTER — Ambulatory Visit (INDEPENDENT_AMBULATORY_CARE_PROVIDER_SITE_OTHER): Payer: 59 | Admitting: Psychology

## 2018-06-04 DIAGNOSIS — F419 Anxiety disorder, unspecified: Secondary | ICD-10-CM

## 2018-06-08 ENCOUNTER — Encounter: Payer: Self-pay | Admitting: Physician Assistant

## 2018-06-16 ENCOUNTER — Ambulatory Visit (INDEPENDENT_AMBULATORY_CARE_PROVIDER_SITE_OTHER): Payer: 59 | Admitting: Psychology

## 2018-06-16 DIAGNOSIS — F419 Anxiety disorder, unspecified: Secondary | ICD-10-CM

## 2018-06-17 ENCOUNTER — Encounter (INDEPENDENT_AMBULATORY_CARE_PROVIDER_SITE_OTHER): Payer: 59 | Admitting: Physician Assistant

## 2018-06-17 DIAGNOSIS — R112 Nausea with vomiting, unspecified: Secondary | ICD-10-CM | POA: Diagnosis not present

## 2018-06-17 DIAGNOSIS — R5383 Other fatigue: Secondary | ICD-10-CM

## 2018-06-17 DIAGNOSIS — Z20828 Contact with and (suspected) exposure to other viral communicable diseases: Secondary | ICD-10-CM | POA: Diagnosis not present

## 2018-06-17 DIAGNOSIS — G43019 Migraine without aura, intractable, without status migrainosus: Secondary | ICD-10-CM

## 2018-06-17 DIAGNOSIS — B349 Viral infection, unspecified: Secondary | ICD-10-CM

## 2018-06-17 DIAGNOSIS — R5381 Other malaise: Secondary | ICD-10-CM

## 2018-06-17 DIAGNOSIS — Z20822 Contact with and (suspected) exposure to covid-19: Secondary | ICD-10-CM

## 2018-06-18 ENCOUNTER — Telehealth: Payer: Self-pay | Admitting: *Deleted

## 2018-06-18 ENCOUNTER — Ambulatory Visit: Payer: Self-pay | Admitting: Physician Assistant

## 2018-06-18 ENCOUNTER — Other Ambulatory Visit: Payer: Self-pay

## 2018-06-18 DIAGNOSIS — Z20822 Contact with and (suspected) exposure to covid-19: Secondary | ICD-10-CM

## 2018-06-18 MED ORDER — KETOROLAC TROMETHAMINE 15.75 MG/SPRAY NA SOLN: 1.0000 | Freq: Four times a day (QID) | NASAL | 0 refills | Status: AC | PRN

## 2018-06-18 MED ORDER — PROMETHAZINE HCL 25 MG PO TABS: 12.5000 mg | ORAL_TABLET | Freq: Four times a day (QID) | ORAL | 2 refills | Status: DC | PRN

## 2018-06-18 NOTE — Telephone Encounter (Signed)
Pt notified for covid-19 test for today at [facility] test site at 3:15.  Advised to wear a mask, stay in care with windows rolled up. Voiced understanding.

## 2018-06-18 NOTE — Addendum Note (Signed)
Addended by: Nelson Chimes E on: 06/18/2018 02:29 PM   Modules accepted: Orders

## 2018-06-18 NOTE — Telephone Encounter (Signed)
Patient with 2 days of malaise, myalgia and headache. Reports close contact with COVID-19 positive co-worker. Self quarantine instructions sent via MyChart Drive-up testing ordered via [place]

## 2018-06-18 NOTE — Telephone Encounter (Addendum)
Virtual Visit via Telephone Note  I connected with Sean Knight on 06/18/18 at 2:20 pm via [place] and telephone    History of Present Illness: Patient with new onset malaise, myalgia, nausea/vomiting and headache beginning this morning. Headache is rated as moderate-severe, consistent with his usual migraines. He is taking Aleve cold & sinus with mild relief. Reports close contact with COVID-19 positive co-worker. Denies cough or SOB. Fever unknown- does not own thermometer. See MyChart message for details    Observations/Objective: Pulm: normal work of breathing, normal phonation Neuro: alert and oriented x 3 Psych: cooperative,  speech is articulate, normal rate and volume; thought processes clear and goal-directed, normal judgment, good insight   Assessment and Plan: .Diagnoses and all orders for this visit:  Close Exposure to Covid-19 Virus -     MYCHART COVID-19 HOME MONITORING PROGRAM  Malaise and fatigue -     MYCHART COVID-19 HOME MONITORING PROGRAM  Non-intractable vomiting with nausea, unspecified vomiting type -     promethazine (PHENERGAN) 25 MG tablet; Take 0.5-1 tablets (12.5-25 mg total) by mouth every 6 (six) hours as needed for nausea or vomiting.  Acute viral syndrome -     promethazine (PHENERGAN) 25 MG tablet; Take 0.5-1 tablets (12.5-25 mg total) by mouth every 6 (six) hours as needed for nausea or vomiting.  Intractable migraine without aura and without status migrainosus -     Ketorolac Tromethamine 15.75 MG/SPRAY SOLN; Place 1 spray into the nose every 6 (six) hours as needed for up to 3 days (headache/migraine).   Self quarantine instructions and work note sent via Marketing executive ordered via [facility] Patient counseled on supportive care  Follow Up Instructions:    I discussed the assessment and treatment plan with the patient. The patient was provided an opportunity to ask questions and all were answered. The patient  agreed with the plan and demonstrated an understanding of the instructions.   The patient was advised to call back or seek an in-person evaluation if the symptoms worsen or if the condition fails to improve as anticipated.  I provided 5-10 minutes of non-face-to-face time during this encounter.   Trixie Dredge, [place]

## 2018-06-19 ENCOUNTER — Encounter (INDEPENDENT_AMBULATORY_CARE_PROVIDER_SITE_OTHER): Payer: Self-pay

## 2018-06-20 ENCOUNTER — Encounter (INDEPENDENT_AMBULATORY_CARE_PROVIDER_SITE_OTHER): Payer: Self-pay

## 2018-06-21 ENCOUNTER — Encounter (INDEPENDENT_AMBULATORY_CARE_PROVIDER_SITE_OTHER): Payer: Self-pay

## 2018-06-21 LAB — NOVEL CORONAVIRUS, NAA: SARS-CoV-2, NAA: NOT DETECTED

## 2018-06-22 ENCOUNTER — Encounter (INDEPENDENT_AMBULATORY_CARE_PROVIDER_SITE_OTHER): Payer: Self-pay

## 2018-06-26 ENCOUNTER — Ambulatory Visit (INDEPENDENT_AMBULATORY_CARE_PROVIDER_SITE_OTHER): Payer: 59 | Admitting: Psychology

## 2018-06-26 ENCOUNTER — Ambulatory Visit: Payer: 59 | Admitting: Psychology

## 2018-06-26 DIAGNOSIS — F419 Anxiety disorder, unspecified: Secondary | ICD-10-CM | POA: Diagnosis not present

## 2018-06-27 ENCOUNTER — Encounter: Payer: Self-pay | Admitting: Physician Assistant

## 2018-06-27 ENCOUNTER — Ambulatory Visit (INDEPENDENT_AMBULATORY_CARE_PROVIDER_SITE_OTHER): Payer: 59 | Admitting: Physician Assistant

## 2018-06-27 DIAGNOSIS — F39 Unspecified mood [affective] disorder: Secondary | ICD-10-CM

## 2018-06-27 DIAGNOSIS — F4323 Adjustment disorder with mixed anxiety and depressed mood: Secondary | ICD-10-CM
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
## 2018-06-30 ENCOUNTER — Ambulatory Visit (INDEPENDENT_AMBULATORY_CARE_PROVIDER_SITE_OTHER): Payer: 59 | Admitting: Psychology

## 2018-06-30 DIAGNOSIS — F419 Anxiety disorder, unspecified: Secondary | ICD-10-CM | POA: Diagnosis not present

## 2018-07-01 ENCOUNTER — Encounter: Payer: Self-pay | Admitting: Physician Assistant

## 2018-07-01 MED ORDER — QUETIAPINE FUMARATE 100 MG PO TABS: 100.0000 mg | ORAL_TABLET | Freq: Two times a day (BID) | ORAL | 1 refills | Status: DC

## 2018-07-01 MED ORDER — MIRTAZAPINE 7.5 MG PO TABS: ORAL_TABLET | ORAL | 0 refills | Status: DC

## 2018-07-01 MED ORDER — HYDROXYZINE HCL 50 MG PO TABS: 50.0000 mg | ORAL_TABLET | Freq: Four times a day (QID) | ORAL | 1 refills | Status: DC | PRN

## 2018-07-14 ENCOUNTER — Ambulatory Visit: Payer: 59 | Admitting: Psychology

## 2018-07-24 ENCOUNTER — Ambulatory Visit: Payer: 59 | Admitting: Psychology

## 2018-07-25 ENCOUNTER — Other Ambulatory Visit: Payer: Self-pay | Admitting: Physician Assistant

## 2018-07-25 DIAGNOSIS — F4323 Adjustment disorder with mixed anxiety and depressed mood: Secondary | ICD-10-CM

## 2018-07-25 DIAGNOSIS — F39 Unspecified mood [affective] disorder: Secondary | ICD-10-CM

## 2018-07-30 ENCOUNTER — Ambulatory Visit: Payer: 59 | Admitting: Psychology

## 2018-08-21 ENCOUNTER — Other Ambulatory Visit: Payer: Self-pay | Admitting: Physician Assistant

## 2018-08-21 ENCOUNTER — Telehealth: Payer: Self-pay | Admitting: Physician Assistant

## 2018-08-21 DIAGNOSIS — F4323 Adjustment disorder with mixed anxiety and depressed mood: Secondary | ICD-10-CM

## 2018-08-21 DIAGNOSIS — F39 Unspecified mood [affective] disorder: Secondary | ICD-10-CM

## 2018-08-21 MED ORDER — MIRTAZAPINE 7.5 MG PO TABS: 7.5000 mg | ORAL_TABLET | Freq: Two times a day (BID) | ORAL | 0 refills | Status: DC

## 2018-08-21 MED ORDER — HYDROXYZINE HCL 50 MG PO TABS: 50.0000 mg | ORAL_TABLET | Freq: Four times a day (QID) | ORAL | 0 refills | Status: DC | PRN

## 2018-08-21 MED ORDER — QUETIAPINE FUMARATE 100 MG PO TABS: 100.0000 mg | ORAL_TABLET | Freq: Every day | ORAL | 0 refills | Status: DC

## 2018-08-21 NOTE — Telephone Encounter (Signed)
Refills sent -EH/RMA

## 2018-08-21 NOTE — Telephone Encounter (Signed)
Patient is requesting refills on Quetiapine, hydroxyzine, and mirtazapine due to him still not having work and it was cheaper the way she last refilled it.

## 2019-01-28 ENCOUNTER — Ambulatory Visit: Payer: Self-pay | Attending: Internal Medicine

## 2019-01-28 DIAGNOSIS — Z20822 Contact with and (suspected) exposure to covid-19: Secondary | ICD-10-CM | POA: Insufficient documentation

## 2019-01-29 LAB — NOVEL CORONAVIRUS, NAA: SARS-CoV-2, NAA: NOT DETECTED

## 2019-02-19 ENCOUNTER — Other Ambulatory Visit: Payer: Self-pay | Admitting: Physician Assistant

## 2019-02-19 DIAGNOSIS — F39 Unspecified mood [affective] disorder: Secondary | ICD-10-CM

## 2019-03-09 ENCOUNTER — Other Ambulatory Visit: Payer: Self-pay | Admitting: Sports Medicine

## 2019-03-09 ENCOUNTER — Other Ambulatory Visit: Payer: Self-pay | Admitting: Physician Assistant

## 2019-03-09 DIAGNOSIS — F39 Unspecified mood [affective] disorder: Secondary | ICD-10-CM

## 2019-03-09 DIAGNOSIS — F4323 Adjustment disorder with mixed anxiety and depressed mood: Secondary | ICD-10-CM

## 2019-03-20 ENCOUNTER — Ambulatory Visit (INDEPENDENT_AMBULATORY_CARE_PROVIDER_SITE_OTHER): Payer: Self-pay | Admitting: Medical-Surgical

## 2019-03-20 ENCOUNTER — Encounter: Payer: Self-pay | Admitting: Medical-Surgical

## 2019-03-20 ENCOUNTER — Other Ambulatory Visit: Payer: Self-pay

## 2019-03-20 DIAGNOSIS — F39 Unspecified mood [affective] disorder: Secondary | ICD-10-CM

## 2019-03-20 DIAGNOSIS — F4323 Adjustment disorder with mixed anxiety and depressed mood: Secondary | ICD-10-CM

## 2019-03-20 MED ORDER — MIRTAZAPINE 15 MG PO TABS: 7.5000 mg | ORAL_TABLET | Freq: Two times a day (BID) | ORAL | 1 refills | Status: DC

## 2019-03-20 MED ORDER — HYDROXYZINE HCL 50 MG PO TABS: 100.0000 mg | ORAL_TABLET | Freq: Four times a day (QID) | ORAL | 2 refills | Status: DC | PRN

## 2019-03-20 MED ORDER — QUETIAPINE FUMARATE 100 MG PO TABS: 100.0000 mg | ORAL_TABLET | Freq: Two times a day (BID) | ORAL | 1 refills | Status: DC

## 2019-03-20 NOTE — Progress Notes (Signed)
Subjective:    CC: mood disorder follow up  HPI: Pleasant 39 year old male presenting for follow-up on mixed anxiety and depressed mood. Reports that his medications were previously managed by his counselor and PCP working in conjunction with each other and that he was told it was okay to titrate his medications as long as he didn't take more than the maximum daily doses.   Currently taking: Hydroxyzine 150mg  twice daily with a 50mg  dose as needed during the day/night Mirtazapine 7.5mg  twice daily Quetiapine 100mg  twice daily  No alcohol intake but endorses frequent smoking of marijuana to help him sleep. Without marijuana, approximately 2 hours of sleep per night. With marijuana, able to sleep 6-7 hours per night, feels rested.  Lost his job last June due to [place] and is still unemployed. No insurance coverage. No current counseling relationship due to cost but does have plans to restart that once he has insurance again. Denies SI/HI. Reports he feels 80% better than he was mentally.  I reviewed the past medical history, family history, social history, surgical history, and allergies today and no changes were needed.  Please see the problem list section below in epic for further details.  Past Medical History: Past Medical History:  Diagnosis Date  . Allergy   . Asthma    as a child  . Complication of anesthesia    slow to wake up  . Weakness    left hand   Past Surgical History: Past Surgical History:  Procedure Laterality Date  . HAND SURGERY    . HERNIA REPAIR    . INSERTION OF MESH N/A 06/14/2015   Procedure: INSERTION OF MESH;  Surgeon: Donnie Mesa, MD;  Location: [place];  Service: General;  Laterality: N/A;  . LAPAROSCOPIC ASSISTED VENTRAL HERNIA REPAIR N/A 06/14/2015   Procedure: LAPAROSCOPIC ASSISTED VENTRAL HERNIA REPAIR;  Surgeon: Donnie Mesa, MD;  Location: [place] OR;  Service: General;  Laterality: N/A;   Social History: Social History   Socioeconomic History  .  Marital status: Married    Spouse name: Not on file  . Number of children: Not on file  . Years of education: Not on file  . Highest education level: Not on file  Occupational History  . Not on file  Tobacco Use  . Smoking status: Former Smoker    Types: Cigarettes    Quit date: 10/02/2002    Years since quitting: 16.4  . Smokeless tobacco: Former Systems developer    Types: [place] date: 09/13/2017  Substance and Sexual Activity  . Alcohol use: Yes    Comment: rarely  . Drug use: Never  . Sexual activity: Yes    Birth control/protection: None  Other Topics Concern  . Not on file  Social History Narrative  . Not on file   Social Determinants of Health   Financial Resource Strain:   . Difficulty of Paying Living Expenses:   Food Insecurity:   . Worried About Charity fundraiser in the Last Year:   . Arboriculturist in the Last Year:   Transportation Needs:   . Film/video editor (Medical):   Marland Kitchen Lack of Transportation (Non-Medical):   Physical Activity:   . Days of Exercise per Week:   . Minutes of Exercise per Session:   Stress:   . Feeling of Stress :   Social Connections:   . Frequency of Communication with Friends and Family:   . Frequency of Social Gatherings with Friends and Family:   .  Attends Religious Services:   . Active Member of Clubs or Organizations:   . Attends Banker Meetings:   Marland Kitchen Marital Status:    Family History: Family History  Problem Relation Age of Onset  . Mental illness Mother   . Hypertension Mother   . Anxiety disorder Mother   . Bipolar disorder Mother   . Hyperlipidemia Father   . Hypertension Father   . Heart disease Maternal Grandmother   . Heart disease Maternal Grandfather   . Suicidality Maternal Uncle   . Mental illness Maternal Uncle    Allergies: No Known Allergies Medications: See med rec.  Review of Systems: No fevers, chills, night sweats, weight loss, chest pain, or shortness of breath.   Depression  screen [facility] 2/9 03/20/2019 06/27/2018 05/02/2018 03/21/2018 03/06/2018  Decreased Interest 2 1 2 1 1   Down, Depressed, Hopeless 2 1 2 1 1   PHQ - 2 Score 4 2 4 2 2   Altered sleeping 0 0 1 0 0  Tired, decreased energy 2 1 1 1 1   Change in appetite 0 0 2 1 2   Feeling bad or failure about yourself  2 1 2 1 3   Trouble concentrating 0 1 3 0 3  Moving slowly or fidgety/restless 0 0 0 0 1  Suicidal thoughts 0 0 0 0 0  PHQ-9 Score 8 5 13 5 12   Difficult doing work/chores Very difficult - - Somewhat difficult Extremely dIfficult   GAD 7 : Generalized Anxiety Score 03/20/2019 06/27/2018 05/02/2018 03/21/2018  Nervous, Anxious, on Edge 2 2 3 1   Control/stop worrying 2 1 3 1   Worry too much - different things 2 2 3 1   Trouble relaxing 2 1 2 1   Restless 2 1 3  0  Easily annoyed or irritable 2 2 3 1   Afraid - awful might happen 1 1 0 2  Total GAD 7 Score 13 10 17 7   Anxiety Difficulty Very difficult - - -     Objective:    General: Well Developed, well nourished, and in no acute distress.  Neuro: Alert and oriented x3.  HEENT: Normocephalic, atraumatic.  Skin: Warm and dry. Cardiac: Regular rate and rhythm, no murmurs rubs or gallops, no lower extremity edema.  Respiratory: Clear to auscultation bilaterally. Not using accessory muscles, speaking in full sentences.   Impression and Recommendations:    Adjustment disorder with mixed anxiety and depression/mood disorder  Discussed medication regimen and recommended use.  Reviewed max daily and max single dose of all medications.  Strongly advised reestablishing a counseling relationship.  Commend checking community resources to see if he can find counseling at low to no cost.  Sending in mirtazapine 15 mg tabs (lower cost), take one half tab twice daily.  Continue quetiapine 100 mg twice daily, refill provided.  Change hydroxyzine to 100 mg 3-4 times daily as needed.  Strongly advised patient to avoid taking more than 100 mg in a single dose.  Reviewed  marijuana use and safety while using this in conjunction with current medications. Discussed treatment expectations and limitations with the ultimate goal of decreasing medications to the minimal effective doses and developing adequate coping skills. Patient verbalized understanding and agrees with plan.  Return in about 3 months (around 06/20/2019) for mood follow up. May be virtual.  ___________________________________________ , DNP, APRN, FNP-BC Primary Care and Sports Medicine [facility]

## 2019-05-05 ENCOUNTER — Other Ambulatory Visit: Payer: Self-pay

## 2019-05-05 DIAGNOSIS — G43019 Migraine without aura, intractable, without status migrainosus: Secondary | ICD-10-CM

## 2019-05-05 MED ORDER — RIZATRIPTAN BENZOATE 10 MG PO TABS: 10.0000 mg | ORAL_TABLET | ORAL | 1 refills | Status: DC | PRN

## 2019-06-22 ENCOUNTER — Ambulatory Visit: Payer: Self-pay | Admitting: Medical-Surgical

## 2019-07-31 ENCOUNTER — Encounter: Payer: Self-pay | Admitting: Sports Medicine

## 2019-07-31 ENCOUNTER — Ambulatory Visit (INDEPENDENT_AMBULATORY_CARE_PROVIDER_SITE_OTHER): Payer: Self-pay

## 2019-07-31 ENCOUNTER — Ambulatory Visit (INDEPENDENT_AMBULATORY_CARE_PROVIDER_SITE_OTHER): Payer: Self-pay | Admitting: Sports Medicine

## 2019-07-31 ENCOUNTER — Other Ambulatory Visit: Payer: Self-pay

## 2019-07-31 DIAGNOSIS — S99912A Unspecified injury of left ankle, initial encounter: Secondary | ICD-10-CM

## 2019-07-31 DIAGNOSIS — S52502A Unspecified fracture of the lower end of left radius, initial encounter for closed fracture: Secondary | ICD-10-CM | POA: Insufficient documentation

## 2019-07-31 DIAGNOSIS — S93492A Sprain of other ligament of left ankle, initial encounter: Secondary | ICD-10-CM

## 2019-07-31 DIAGNOSIS — S6992XA Unspecified injury of left wrist, hand and finger(s), initial encounter: Secondary | ICD-10-CM

## 2019-07-31 DIAGNOSIS — S52532A Colles' fracture of left radius, initial encounter for closed fracture: Secondary | ICD-10-CM

## 2019-07-31 MED ORDER — MELOXICAM 15 MG PO TABS: ORAL_TABLET | ORAL | 3 refills | Status: DC

## 2019-07-31 NOTE — Assessment & Plan Note (Signed)
Fracture of the dorsal distal radius, overall good radial height, inclination, volar tilt. Adding an Exos fracture brace. Return to see me in 4 weeks for this.

## 2019-07-31 NOTE — Assessment & Plan Note (Signed)
Left ankle inversion injury, no fractures on x-ray, tender over the ATFL, ASO, rehab exercises, meloxicam, return in 2 weeks for this.

## 2019-07-31 NOTE — Progress Notes (Signed)
° ° °  Procedures performed today:    Short arm cast applied on the left forearm.  Independent interpretation of notes and tests performed by another provider:   None.  Brief History, Exam, Impression, and Recommendations:    Left ankle sprain Left ankle inversion injury, no fractures on x-ray, tender over the ATFL, ASO, rehab exercises, meloxicam, return in 2 weeks for this.  Closed fracture of left distal radius Fracture of the dorsal distal radius, overall good radial height, inclination, volar tilt. Adding an Exos fracture brace. Return to see me in 4 weeks for this.    ___________________________________________ Ihor Austin. Benjamin Stain, M.D., ABFM., CAQSM. Primary Care and Sports Medicine [facility]  Adjunct Instructor of Family Medicine  [employer]

## 2019-08-24 ENCOUNTER — Encounter: Payer: Self-pay | Admitting: Sports Medicine

## 2019-08-24 ENCOUNTER — Ambulatory Visit (INDEPENDENT_AMBULATORY_CARE_PROVIDER_SITE_OTHER): Payer: Self-pay | Admitting: Sports Medicine

## 2019-08-24 DIAGNOSIS — S93492A Sprain of other ligament of left ankle, initial encounter: Secondary | ICD-10-CM

## 2019-08-24 DIAGNOSIS — S52532D Colles' fracture of left radius, subsequent encounter for closed fracture with routine healing: Secondary | ICD-10-CM

## 2019-08-24 NOTE — Progress Notes (Signed)
    Procedures performed today:    None.  Independent interpretation of notes and tests performed by another provider:   None.  Brief History, Exam, Impression, and Recommendations:    Sean Knight is a pleasant 39yo male who presented today for a f/u of a distal radial fracture he had 1 month ago. He endorses that his pain has improved, but is still present. He has tenderness to palpation over the fracture site. We are going to keep him in the exos cast for another couple weeks at least until there is no pain over the fracture site. He will follow up in 1 month for reevaluation.   He also had a minor ankle sprain at the past visit that has completely resolved. No further management of this issue is needed.   Aurelio Jew, MS3   ___________________________________________ Ihor Austin. Benjamin Stain, M.D., ABFM., CAQSM. Primary Care and Sports Medicine [place] [facility]  Adjunct Instructor of Family Medicine  [employer]

## 2019-08-24 NOTE — Assessment & Plan Note (Signed)
Sean Knight is a pleasant 39 year old male, he is now about a month post fracture of the dorsal left distal radius without angulation, displacement. He has been in an Exos cast and is doing extremely well. Still has a bit of pain over the fracture. He will continue the Exos for at least another 2 weeks until nontender over the fracture and then discontinue, we did discuss tracing out the alphabet with his index finger to help improve his range of motion when he comes out of the cast.

## 2019-08-24 NOTE — Assessment & Plan Note (Signed)
Resolved

## 2019-09-01 ENCOUNTER — Ambulatory Visit (INDEPENDENT_AMBULATORY_CARE_PROVIDER_SITE_OTHER): Payer: Self-pay | Admitting: Medical-Surgical

## 2019-09-01 ENCOUNTER — Other Ambulatory Visit: Payer: Self-pay

## 2019-09-01 ENCOUNTER — Encounter: Payer: Self-pay | Admitting: Medical-Surgical

## 2019-09-01 VITALS — BP 141/106 | HR 73 | Temp 97.4°F | Ht 72.0 in | Wt 267.1 lb

## 2019-09-01 DIAGNOSIS — F411 Generalized anxiety disorder: Secondary | ICD-10-CM

## 2019-09-01 DIAGNOSIS — F39 Unspecified mood [affective] disorder: Secondary | ICD-10-CM

## 2019-09-01 DIAGNOSIS — G43019 Migraine without aura, intractable, without status migrainosus: Secondary | ICD-10-CM

## 2019-09-01 DIAGNOSIS — F331 Major depressive disorder, recurrent, moderate: Secondary | ICD-10-CM

## 2019-09-01 DIAGNOSIS — Z23 Encounter for immunization: Secondary | ICD-10-CM

## 2019-09-01 DIAGNOSIS — F4323 Adjustment disorder with mixed anxiety and depressed mood: Secondary | ICD-10-CM

## 2019-09-01 MED ORDER — MIRTAZAPINE 15 MG PO TABS: 7.5000 mg | ORAL_TABLET | Freq: Two times a day (BID) | ORAL | 1 refills | Status: DC

## 2019-09-01 MED ORDER — HYDROXYZINE HCL 50 MG PO TABS: 100.0000 mg | ORAL_TABLET | Freq: Four times a day (QID) | ORAL | 1 refills | Status: AC | PRN

## 2019-09-01 MED ORDER — RIZATRIPTAN BENZOATE 10 MG PO TABS: 10.0000 mg | ORAL_TABLET | ORAL | 1 refills | Status: DC | PRN

## 2019-09-01 MED ORDER — QUETIAPINE FUMARATE 100 MG PO TABS: 100.0000 mg | ORAL_TABLET | Freq: Two times a day (BID) | ORAL | 1 refills | Status: DC

## 2019-09-01 NOTE — Progress Notes (Signed)
Subjective:    CC: mood follow up  HPI: Pleasant 39 year old male presenting for mood follow up. Has been doing well overall. Feels that the Seroquel and Mirtazapine twice daily does well keeping his depression symptoms stable. Does still have breakthrough anxiety that he uses hydroxyzine for. At our last appointment he was taking 150mg  three times daily with some days needing another dose. Since our discussion on safe dosage of the medication, he has worked to reduce his use of hydroxyzine and is now only taking 100mg  twice daily. He states this manages his anxiety fairly well but admits decreasing the dosage and frequency of use was a bit rough. Denies SI/HI. No recent fevers, chills, chest pain, shortness of breath, or GI complaints.   I reviewed the past medical history, family history, social history, surgical history, and allergies today and no changes were needed.  Please see the problem list section below in epic for further details.  Past Medical History: Past Medical History:  Diagnosis Date  . Allergy   . Asthma    as a child  . Complication of anesthesia    slow to wake up  . Weakness    left hand   Past Surgical History: Past Surgical History:  Procedure Laterality Date  . HAND SURGERY    . HERNIA REPAIR    . INSERTION OF MESH N/A 06/14/2015   Procedure: INSERTION OF MESH;  Surgeon: , MD;  Location: [facility] OR;  Service: General;  Laterality: N/A;  . LAPAROSCOPIC ASSISTED VENTRAL HERNIA REPAIR N/A 06/14/2015   Procedure: LAPAROSCOPIC ASSISTED VENTRAL HERNIA REPAIR;  Surgeon: Manus Rudd, MD;  Location: [facility] OR;  Service: General;  Laterality: N/A;   Social History: Social History   Socioeconomic History  . Marital status: Legally Separated    Spouse name: Not on file  . Number of children: Not on file  . Years of education: Not on file  . Highest education level: Not on file  Occupational History  . Not on file  Tobacco Use  . Smoking status: Former  Smoker    Types: Cigarettes    Quit date: 10/02/2002    Years since quitting: 16.9  . Smokeless tobacco: Former Manus Rudd    Types: Chew    Quit date: 09/13/2017  Vaping Use  . Vaping Use: Never used  Substance and Sexual Activity  . Alcohol use: Not Currently    Comment: rarely  . Drug use: Yes    Types: Marijuana    Comment: Daily  . Sexual activity: Not Currently    Birth control/protection: None  Other Topics Concern  . Not on file  Social History Narrative  . Not on file   Social Determinants of Health   Financial Resource Strain:   . Difficulty of Paying Living Expenses: Not on file  Food Insecurity:   . Worried About Neurosurgeon in the Last Year: Not on file  . Ran Out of Food in the Last Year: Not on file  Transportation Needs:   . Lack of Transportation (Medical): Not on file  . Lack of Transportation (Non-Medical): Not on file  Physical Activity:   . Days of Exercise per Week: Not on file  . Minutes of Exercise per Session: Not on file  Stress:   . Feeling of Stress : Not on file  Social Connections:   . Frequency of Communication with Friends and Family: Not on file  . Frequency of Social Gatherings with Friends and Family: Not on file  .  Attends Religious Services: Not on file  . Active Member of Clubs or Organizations: Not on file  . Attends Banker Meetings: Not on file  . Marital Status: Not on file   Family History: Family History  Problem Relation Age of Onset  . Mental illness Mother   . Hypertension Mother   . Anxiety disorder Mother   . Bipolar disorder Mother   . Hyperlipidemia Father   . Hypertension Father   . Heart disease Maternal Grandmother   . Heart disease Maternal Grandfather   . Suicidality Maternal Uncle   . Mental illness Maternal Uncle    Allergies: No Known Allergies Medications: See med rec.  Review of Systems: See HPI for pertinent positives and negatives.   Objective:    General: Well Developed,  well nourished, and in no acute distress.  Neuro: Alert and oriented x3.  HEENT: Normocephalic, atraumatic.  Skin: Warm and dry. Cardiac: Regular rate and rhythm, no murmurs rubs or gallops, no lower extremity edema.  Respiratory: Clear to auscultation bilaterally. Not using accessory muscles, speaking in full sentences.  Impression and Recommendations:    1. Need for influenza vaccination Flu vaccine given in office by MA. - Flu Vaccine QUAD 36+ mos IM  2. Mood disorder (HCC) Continue Quetiapine and mirtazapine as prescribed. Refills provided. - QUEtiapine (SEROQUEL) 100 MG tablet; Take 1 tablet (100 mg total) by mouth 2 (two) times daily.  Dispense: 180 tablet; Refill: 1 - mirtazapine (REMERON) 15 MG tablet; Take 0.5 tablets (7.5 mg total) by mouth in the morning and at bedtime.  Dispense: 90 tablet; Refill: 1  3. GAD/depression Continue Quetiapine and Mirtazapine as prescribed. Doing better on hydroxyzine and is stable on 100mg  BID as needed. Refills of meds provided.  4. Intractable migraine without aura and without status migrainosus Continue rizatriptan as needed. Refill prescribed. - rizatriptan (MAXALT) 10 MG tablet; Take 1 tablet (10 mg total) by mouth as needed for migraine. May repeat in 2 hours if needed  Dispense: 10 tablet; Refill: 1  Return in about 3 months (around 12/01/2019) for mood follow up. ___________________________________________ 12/03/2019, DNP, APRN, FNP-BC Primary Care and Sports Medicine [facility]

## 2019-09-01 NOTE — Patient Instructions (Signed)

## 2019-09-24 ENCOUNTER — Ambulatory Visit: Payer: Self-pay | Admitting: Sports Medicine

## 2019-12-01 ENCOUNTER — Ambulatory Visit (INDEPENDENT_AMBULATORY_CARE_PROVIDER_SITE_OTHER): Payer: Self-pay | Admitting: Medical-Surgical

## 2019-12-01 ENCOUNTER — Encounter: Payer: Self-pay | Admitting: Medical-Surgical

## 2019-12-01 ENCOUNTER — Ambulatory Visit: Payer: Self-pay | Admitting: Medical-Surgical

## 2019-12-01 ENCOUNTER — Other Ambulatory Visit: Payer: Self-pay

## 2019-12-01 VITALS — BP 137/79 | HR 108 | Temp 98.8°F | Ht 72.0 in | Wt 263.8 lb

## 2019-12-01 DIAGNOSIS — F4323 Adjustment disorder with mixed anxiety and depressed mood: Secondary | ICD-10-CM

## 2019-12-01 DIAGNOSIS — F5102 Adjustment insomnia: Secondary | ICD-10-CM

## 2019-12-01 DIAGNOSIS — F39 Unspecified mood [affective] disorder: Secondary | ICD-10-CM

## 2019-12-01 MED ORDER — QUETIAPINE FUMARATE 100 MG PO TABS: 100.0000 mg | ORAL_TABLET | Freq: Two times a day (BID) | ORAL | 1 refills | Status: DC

## 2019-12-01 MED ORDER — MIRTAZAPINE 15 MG PO TABS: 7.5000 mg | ORAL_TABLET | Freq: Two times a day (BID) | ORAL | 1 refills | Status: DC

## 2019-12-01 MED ORDER — HYDROXYZINE HCL 50 MG PO TABS: 100.0000 mg | ORAL_TABLET | Freq: Four times a day (QID) | ORAL | 2 refills | Status: DC | PRN

## 2019-12-01 NOTE — Progress Notes (Signed)
Subjective:    CC: mood follow up  HPI: Pleasant 39 year old male presenting today for mood follow-up.  Currently taking mirtazapine 7.5 mg twice daily, quetiapine 100 mg twice daily, and hydroxyzine 100-150 mg twice daily to 3 times daily as needed.  Has been on this regimen for a couple of years and feels that usually works well for him.  Unfortunately he is in a bad place at this point in his life.  He has still been unable to find a job and is behind on all of his bills.  He is applied for parental assistance as well as several other community resources.  Reports he did apply for Medicaid but was denied.  Not able to tell me the reason for denial but reports it had something to do with his ex-wife filing first and climbing the children.  Because of his finances, he has been trying to ration out his medications.  He is taking Seroquel 100 mg at night and omitting the morning dose.  He is also using the hydroxyzine less than he usually would.  Because of this his depression and anxiety scores have increased.  Notes that he is oversleeping although some nights he is unable to sleep at all.  Reports he fell asleep yesterday while waiting to go pick his children up from school and was unable to get there to pick them up on time.  Strongly denies SI/HI. Is aware of mental health resources for emergency care.   I reviewed the past medical history, family history, social history, surgical history, and allergies today and no changes were needed.  Please see the problem list section below in epic for further details.  Past Medical History: Past Medical History:  Diagnosis Date  . Allergy   . Asthma    as a child  . Complication of anesthesia    slow to wake up  . Weakness    left hand   Past Surgical History: Past Surgical History:  Procedure Laterality Date  . HAND SURGERY    . HERNIA REPAIR    . INSERTION OF MESH N/A 06/14/2015   Procedure: INSERTION OF MESH;  Surgeon: Manus Rudd, MD;   Location: [facility] OR;  Service: General;  Laterality: N/A;  . LAPAROSCOPIC ASSISTED VENTRAL HERNIA REPAIR N/A 06/14/2015   Procedure: LAPAROSCOPIC ASSISTED VENTRAL HERNIA REPAIR;  Surgeon: Manus Rudd, MD;  Location: [facility] OR;  Service: General;  Laterality: N/A;   Social History: Social History   Socioeconomic History  . Marital status: Divorced    Spouse name: Not on file  . Number of children: Not on file  . Years of education: Not on file  . Highest education level: Not on file  Occupational History  . Not on file  Tobacco Use  . Smoking status: Former Smoker    Types: Cigarettes    Quit date: 10/02/2002    Years since quitting: 17.1  . Smokeless tobacco: Former Neurosurgeon    Types: Chew    Quit date: 09/13/2017  Vaping Use  . Vaping Use: Never used  Substance and Sexual Activity  . Alcohol use: Not Currently    Comment: rarely  . Drug use: Yes    Types: Marijuana    Comment: Daily  . Sexual activity: Not Currently    Birth control/protection: None  Other Topics Concern  . Not on file  Social History Narrative  . Not on file   Social Determinants of Health   Financial Resource Strain:   . Difficulty of  Paying Living Expenses: Not on file  Food Insecurity:   . Worried About Programme researcher, broadcasting/film/video in the Last Year: Not on file  . Ran Out of Food in the Last Year: Not on file  Transportation Needs:   . Lack of Transportation (Medical): Not on file  . Lack of Transportation (Non-Medical): Not on file  Physical Activity:   . Days of Exercise per Week: Not on file  . Minutes of Exercise per Session: Not on file  Stress:   . Feeling of Stress : Not on file  Social Connections:   . Frequency of Communication with Friends and Family: Not on file  . Frequency of Social Gatherings with Friends and Family: Not on file  . Attends Religious Services: Not on file  . Active Member of Clubs or Organizations: Not on file  . Attends Banker Meetings: Not on file  . Marital  Status: Not on file   Family History: Family History  Problem Relation Age of Onset  . Mental illness Mother   . Hypertension Mother   . Anxiety disorder Mother   . Bipolar disorder Mother   . Hyperlipidemia Father   . Hypertension Father   . Heart disease Maternal Grandmother   . Heart disease Maternal Grandfather   . Suicidality Maternal Uncle   . Mental illness Maternal Uncle    Allergies: No Known Allergies Medications: See med rec.  Review of Systems: See HPI for pertinent positives and negatives.   Depression screen [facility] 2/9 12/01/2019 09/01/2019 03/20/2019 06/27/2018 05/02/2018  Decreased Interest 2 1 2 1 2   Down, Depressed, Hopeless 2 1 2 1 2   PHQ - 2 Score 4 2 4 2 4   Altered sleeping 3 2 0 0 1  Tired, decreased energy 3 1 2 1 1   Change in appetite 2 0 0 0 2  Feeling bad or failure about yourself  2 1 2 1 2   Trouble concentrating 1 1 0 1 3  Moving slowly or fidgety/restless 0 0 0 0 0  Suicidal thoughts 0 0 0 0 0  PHQ-9 Score 15 7 8 5 13   Difficult doing work/chores Somewhat difficult Somewhat difficult Very difficult - -  Some recent data might be hidden   GAD 7 : Generalized Anxiety Score 12/01/2019 09/01/2019 03/20/2019 06/27/2018  Nervous, Anxious, on Edge 2 3 2 2   Control/stop worrying 3 1 2 1   Worry too much - different things 3 1 2 2   Trouble relaxing 3 0 2 1  Restless 2 1 2 1   Easily annoyed or irritable 3 0 2 2  Afraid - awful might happen 2 0 1 1  Total GAD 7 Score 18 6 13 10   Anxiety Difficulty Somewhat difficult Somewhat difficult Very difficult -    Objective:    General: Well Developed, well nourished, and in no acute distress.  Neuro: Alert and oriented x3.  HEENT: Normocephalic, atraumatic.  Skin: Warm and dry. Cardiac: Regular rate and rhythm, no murmurs rubs or gallops, no lower extremity edema.  Respiratory: Clear to auscultation bilaterally. Not using accessory muscles, speaking in full sentences.   Impression and Recommendations:    1.  Insomnia due to psychological stress Continue Seroquel 100 mg at bedtime.  Sleeping troubles and inconsistencies are likely related to cutting back his medications.  2. Mood disorder (HCC) Continue Seroquel and mirtazapine as prescribed. - QUEtiapine (SEROQUEL) 100 MG tablet; Take 1 tablet (100 mg total) by mouth 2 (two) times daily.  Dispense:  180 tablet; Refill: 1 - mirtazapine (REMERON) 15 MG tablet; Take 0.5 tablets (7.5 mg total) by mouth in the morning and at bedtime.  Dispense: 90 tablet; Refill: 1  3. Adjustment disorder with mixed anxiety and depressed mood Continue current regimen of Seroquel, Remeron, and hydroxyzine.  Refills provided.  Discussed other resources in the community to help obtain meds.  Information provided on crisis control ministries located in [place] and [place].  Numbers included with information.  Patient advised to contact them and ask about pharmaceutical assistance.  If he should need his prescriptions sent to a different location, I will be glad to do so if this will help him get the medicines he needs. - mirtazapine (REMERON) 15 MG tablet; Take 0.5 tablets (7.5 mg total) by mouth in the morning and at bedtime.  Dispense: 90 tablet; Refill: 1 - hydrOXYzine (ATARAX/VISTARIL) 50 MG tablet; Take 2 tablets (100 mg total) by mouth 4 (four) times daily as needed for anxiety.  Dispense: 180 tablet; Refill: 2  Return in about 3 months (around 02/29/2020) for mood follow up. ___________________________________________ Thayer Ohm, DNP, APRN, FNP-BC Primary Care and Sports Medicine [facility]

## 2019-12-01 NOTE — Patient Instructions (Signed)
[facility]  WELCOME! Need assistance with rent, mortgage, utilities, food, and prescription medications? Both offices are conducting phone interviews for assistance at this time. Call our Client Services phone numbers for more information.  [place]: [phone]   [place]: [phone]  Call them ASAP!!! Let me know if I need to send prescriptions in to a different place based on what they say.

## 2020-01-27 ENCOUNTER — Ambulatory Visit: Payer: Self-pay | Admitting: Medical-Surgical

## 2020-02-29 ENCOUNTER — Encounter: Payer: Self-pay | Admitting: Medical-Surgical

## 2020-02-29 ENCOUNTER — Other Ambulatory Visit: Payer: Self-pay

## 2020-02-29 ENCOUNTER — Ambulatory Visit (INDEPENDENT_AMBULATORY_CARE_PROVIDER_SITE_OTHER): Payer: Self-pay | Admitting: Medical-Surgical

## 2020-02-29 VITALS — BP 124/87 | HR 84 | Temp 99.2°F | Ht 72.0 in | Wt 278.2 lb

## 2020-02-29 DIAGNOSIS — F39 Unspecified mood [affective] disorder: Secondary | ICD-10-CM

## 2020-02-29 DIAGNOSIS — F5102 Adjustment insomnia: Secondary | ICD-10-CM

## 2020-02-29 DIAGNOSIS — F411 Generalized anxiety disorder: Secondary | ICD-10-CM

## 2020-02-29 DIAGNOSIS — F331 Major depressive disorder, recurrent, moderate: Secondary | ICD-10-CM

## 2020-02-29 DIAGNOSIS — F4323 Adjustment disorder with mixed anxiety and depressed mood: Secondary | ICD-10-CM

## 2020-02-29 MED ORDER — HYDROXYZINE HCL 50 MG PO TABS: 100.0000 mg | ORAL_TABLET | Freq: Four times a day (QID) | ORAL | 2 refills | Status: AC | PRN

## 2020-02-29 MED ORDER — MIRTAZAPINE 15 MG PO TABS: 7.5000 mg | ORAL_TABLET | Freq: Two times a day (BID) | ORAL | 1 refills | Status: DC

## 2020-02-29 MED ORDER — QUETIAPINE FUMARATE 100 MG PO TABS: 100.0000 mg | ORAL_TABLET | Freq: Two times a day (BID) | ORAL | 1 refills | Status: DC

## 2020-02-29 NOTE — Progress Notes (Signed)
Subjective:    CC: mood follow up  HPI: Pleasant 40 year old male presenting today for mood and insomnia follow-up.  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  He has been at this job for about 3 weeks and notes that he is happy to have money coming in.  His job does Education officer, environmental which will be kicking and after the first 30 days.  Notes that his mood has been improving and he is doing fairly well.  He does admit to moments of realizing that he is no longer in a funk and not knowing what to do with himself.  He has been taking mirtazapine 7.5 mg twice daily, quetiapine 100 mg at bedtime, and hydroxyzine 100 mg 3 times daily.  He does feel the medications are working well and reports the quetiapine insufficiency has gotten better as his mood has gradually improved.  He is still having difficulty sleeping but this is more related to difficulty getting comfortable as well as feeling as if he cannot breathe which he contributes to weight gain.  He has never had a sleep study but does admit to loud snoring and having been told but he stopped breathing in his sleep a couple of times.  Uses Maxalt as needed migraines.  He does still have some at home and reports not having to use these in the last 2 months.  I reviewed the past medical history, family history, social history, surgical history, and allergies today and no changes were needed.  Please see the problem list section below in epic for further details.  Past Medical History: Past Medical History:  Diagnosis Date  . Allergy   . Asthma    as a child  . Complication of anesthesia    slow to wake up  . Weakness    left hand   Past Surgical History: Past Surgical History:  Procedure Laterality Date  . HAND SURGERY    . HERNIA REPAIR    . INSERTION OF MESH N/A 06/14/2015   Procedure: INSERTION OF MESH;  Surgeon: Manus Rudd, MD;  Location: [facility] OR;  Service: General;   Laterality: N/A;  . LAPAROSCOPIC ASSISTED VENTRAL HERNIA REPAIR N/A 06/14/2015   Procedure: LAPAROSCOPIC ASSISTED VENTRAL HERNIA REPAIR;  Surgeon: Manus Rudd, MD;  Location: [facility] OR;  Service: General;  Laterality: N/A;   Social History: Social History   Socioeconomic History  . Marital status: Divorced    Spouse name: Not on file  . Number of children: Not on file  . Years of education: Not on file  . Highest education level: Not on file  Occupational History  . Not on file  Tobacco Use  . Smoking status: Former Smoker    Types: Cigarettes    Quit date: 10/02/2002    Years since quitting: 17.4  . Smokeless tobacco: Former Neurosurgeon    Types: Chew    Quit date: 09/13/2017  Vaping Use  . Vaping Use: Never used  Substance and Sexual Activity  . Alcohol use: Not Currently    Comment: rarely  . Drug use: Yes    Types: Marijuana    Comment: Daily  . Sexual activity: Not Currently    Birth control/protection: None  Other Topics Concern  . Not on file  Social History Narrative  . Not on file   Social Determinants of Health   Financial Resource Strain: Not on file  Food Insecurity:  Not on file  Transportation Needs: Not on file  Physical Activity: Not on file  Stress: Not on file  Social Connections: Not on file   Family History: Family History  Problem Relation Age of Onset  . Mental illness Mother   . Hypertension Mother   . Anxiety disorder Mother   . Bipolar disorder Mother   . Hyperlipidemia Father   . Hypertension Father   . Heart disease Maternal Grandmother   . Heart disease Maternal Grandfather   . Suicidality Maternal Uncle   . Mental illness Maternal Uncle    Allergies: No Known Allergies Medications: See med rec.  Review of Systems: See HPI for pertinent positives and negatives.   Depression screen [facility] 2/9 02/29/2020 12/01/2019 09/01/2019 03/20/2019 06/27/2018  Decreased Interest 1 2 1 2 1   Down, Depressed, Hopeless 1 2 1 2 1   PHQ - 2 Score 2 4 2 4 2    Altered sleeping 3 3 2  0 0  Tired, decreased energy 1 3 1 2 1   Change in appetite 1 2 0 0 0  Feeling bad or failure about yourself  0 2 1 2 1   Trouble concentrating 0 1 1 0 1  Moving slowly or fidgety/restless 0 0 0 0 0  Suicidal thoughts 0 0 0 0 0  PHQ-9 Score 7 15 7 8 5   Difficult doing work/chores Somewhat difficult Somewhat difficult Somewhat difficult Very difficult -  Some recent data might be hidden   GAD 7 : Generalized Anxiety Score 02/29/2020 12/01/2019 09/01/2019 03/20/2019  Nervous, Anxious, on Edge 2 2 3 2   Control/stop worrying 0 3 1 2   Worry too much - different things 0 3 1 2   Trouble relaxing 1 3 0 2  Restless 0 2 1 2   Easily annoyed or irritable 1 3 0 2  Afraid - awful might happen 1 2 0 1  Total GAD 7 Score 5 18 6 13   Anxiety Difficulty Somewhat difficult Somewhat difficult Somewhat difficult Very difficult     Objective:    General: Well Developed, well nourished, and in no acute distress.  Neuro: Alert and oriented x3.  HEENT: Normocephalic, atraumatic.  Skin: Warm and dry. Cardiac: Regular rate and rhythm, no murmurs rubs or gallops, no lower extremity edema.  Respiratory: Clear to auscultation bilaterally. Not using accessory muscles, speaking in full sentences.  Impression and Recommendations:    1. Adjustment disorder with mixed anxiety and depressed mood 2. GAD (generalized anxiety disorder) 3. Moderate episode of recurrent major depressive disorder (HCC) 4. Mood disorder (HCC) 5. Insomnia due to psychological stress Continue mirtazapine 7.5 mg twice daily, quetiapine 100 mg at bedtime, hydroxyzine 100 mg up to 4 times daily as needed for anxiety.  Discussed weight loss.  Recommend getting a sleep study but we will hold off on this until his insurance kicks in. - hydrOXYzine (ATARAX/VISTARIL) 50 MG tablet; Take 2 tablets (100 mg total) by mouth 4 (four) times daily as needed for anxiety.  Dispense: 180 tablet; Refill: 2 - mirtazapine (REMERON) 15 MG  tablet; Take 0.5 tablets (7.5 mg total) by mouth in the morning and at bedtime.  Dispense: 90 tablet; Refill: 1 - mirtazapine (REMERON) 15 MG tablet; Take 0.5 tablets (7.5 mg total) by mouth in the morning and at bedtime.  Dispense: 90 tablet; Refill: 1 - QUEtiapine (SEROQUEL) 100 MG tablet; Take 1 tablet (100 mg total) by mouth 2 (two) times daily.  Dispense: 180 tablet; Refill: 1   Return in about 3 months (  around 05/28/2020) for mood follow up.  ___________________________________________ Thayer Ohm, DNP, APRN, FNP-BC Primary Care and Sports Medicine [facility]

## 2020-05-27 ENCOUNTER — Telehealth (INDEPENDENT_AMBULATORY_CARE_PROVIDER_SITE_OTHER): Payer: 59 | Admitting: Medical-Surgical

## 2020-05-27 ENCOUNTER — Encounter: Payer: Self-pay | Admitting: Medical-Surgical

## 2020-05-27 VITALS — Ht 72.0 in | Wt 278.0 lb

## 2020-05-27 DIAGNOSIS — F4323 Adjustment disorder with mixed anxiety and depressed mood: Secondary | ICD-10-CM

## 2020-05-27 DIAGNOSIS — F331 Major depressive disorder, recurrent, moderate: Secondary | ICD-10-CM

## 2020-05-27 DIAGNOSIS — F39 Unspecified mood [affective] disorder: Secondary | ICD-10-CM

## 2020-05-27 DIAGNOSIS — F411 Generalized anxiety disorder: Secondary | ICD-10-CM

## 2020-05-27 DIAGNOSIS — U071 COVID-19: Secondary | ICD-10-CM

## 2020-05-27 MED ORDER — ALBUTEROL SULFATE HFA 108 (90 BASE) MCG/ACT IN AERS: 2.0000 | INHALATION_SPRAY | Freq: Four times a day (QID) | RESPIRATORY_TRACT | 0 refills | Status: DC | PRN

## 2020-05-27 MED ORDER — MOLNUPIRAVIR EUA 200MG CAPSULE: 4.0000 | ORAL_CAPSULE | Freq: Two times a day (BID) | ORAL | 0 refills | Status: AC

## 2020-05-27 NOTE — Progress Notes (Signed)
Tested + for Covid 2 days ago Taking tylenol extra strength, staying hydrated Chills/muscle aches/cough/congestion/not able to take a deep breath/change in taste  Does not need refills on medications PHQ9 (13) -GAD7 (19) completed.

## 2020-05-27 NOTE — Progress Notes (Signed)
Virtual Visit via Video Note  I connected with Sean Knight on 05/27/20 at  3:40 PM EDT by a video enabled telemedicine application and verified that I am speaking with the correct person using two identifiers.   I discussed the limitations of evaluation and management by telemedicine and the availability of in person appointments. The patient expressed understanding and agreed to proceed.  Patient location: home Provider locations: office  Subjective:    CC: Mood follow-up, COVID-positive  HPI: Pleasant 40 year old male presenting today via MyChart video visit for mood follow-up.  He has been taking his medications as prescribed, tolerating well without side effects.  Was working up until recently when he was laid off and lost his job.  While he was working, he noted that he was working long hours and worn out in the evening so he did not need to use Seroquel to help fall asleep at night.  Has been using hydroxyzine 2 tablets 4 times daily as needed as well as mirtazapine 7.5 mg twice daily.  Does note some increased anxiety and depression symptoms due to the loss of his job as well as some issues with his apparent claim.  Denies SI/HI.  Tested positive for COVID 2 days ago.  Notes that he is having some difficulty getting a deep breath.  He does have a history of asthma in his childhood.  Does not currently have an albuterol inhaler at home.  Also has chills, congestion, cough, and change in taste.  Using as needed Tylenol for fever and discomfort.  Staying well-hydrated and trying to get rest when possible.  Past medical history, Surgical history, Family history not pertinant except as noted below, Social history, Allergies, and medications have been entered into the medical record, reviewed, and corrections made.   Review of Systems: See HPI for pertinent positives and negatives.   Depression screen [facility] 2/9 05/27/2020 02/29/2020 12/01/2019 09/01/2019 03/20/2019  Decreased Interest 1 1 2 1  2   Down, Depressed, Hopeless 1 1 2 1 2   PHQ - 2 Score 2 2 4 2 4   Altered sleeping 3 3 3 2  0  Tired, decreased energy 3 1 3 1 2   Change in appetite 1 1 2  0 0  Feeling bad or failure about yourself  1 0 2 1 2   Trouble concentrating 3 0 1 1 0  Moving slowly or fidgety/restless 0 0 0 0 0  Suicidal thoughts 0 0 0 0 0  PHQ-9 Score 13 7 15 7 8   Difficult doing work/chores Somewhat difficult Somewhat difficult Somewhat difficult Somewhat difficult Very difficult  Some recent data might be hidden   GAD 7 : Generalized Anxiety Score 05/27/2020 02/29/2020 12/01/2019 09/01/2019  Nervous, Anxious, on Edge 3 2 2 3   Control/stop worrying 3 0 3 1  Worry too much - different things 3 0 3 1  Trouble relaxing 3 1 3  0  Restless 1 0 2 1  Easily annoyed or irritable 3 1 3  0  Afraid - awful might happen 3 1 2  0  Total GAD 7 Score 19 5 18 6   Anxiety Difficulty Very difficult Somewhat difficult Somewhat difficult Somewhat difficult     Objective:    General: Speaking clearly in complete sentences without any shortness of breath.  Alert and oriented x3.  Normal judgment. No apparent acute distress.  Impression and Recommendations:    1. Adjustment disorder with mixed anxiety and depressed mood 2. GAD (generalized anxiety disorder) 3. Moderate episode of recurrent major depressive  disorder (HCC) 4. Mood disorder (HCC) Continue hydroxyzine, mirtazapine, and Seroquel as prescribed.  He does not need refills today.  Worsening of symptoms is very situational and patient agrees that there is no need to adjust his medications today.  5. COVID-19 virus infection Discussed options for treatment.  Sending in Molnupiravir 800mg  BID x 5 days.  Also prescribing albuterol inhaler 2 puffs every 6 hours as needed.  Continue supportive treatment with over-the-counter measures.  Continue to push fluids and rest when possible.   I discussed the assessment and treatment plan with the patient. The patient was provided an  opportunity to ask questions and all were answered. The patient agreed with the plan and demonstrated an understanding of the instructions.   The patient was advised to call back or seek an in-person evaluation if the symptoms worsen or if the condition fails to improve as anticipated.  20 minutes of non-face-to-face time was provided during this encounter.  Return in about 3 months (around 08/27/2020) for mood follow up.  08/29/2020, DNP, APRN, FNP-BC [facility]
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
## 2020-11-29 IMAGING — DX DG ANKLE COMPLETE 3+V*L*
3 series · 3 of 3 positions shown · non-contrast
Comparison: Left foot radiograph dated 06/25/2011.

CLINICAL DATA: 38-year-old male with left ankle injury.

EXAM:
LEFT ANKLE COMPLETE - 3+ VIEW

[ankle ap]
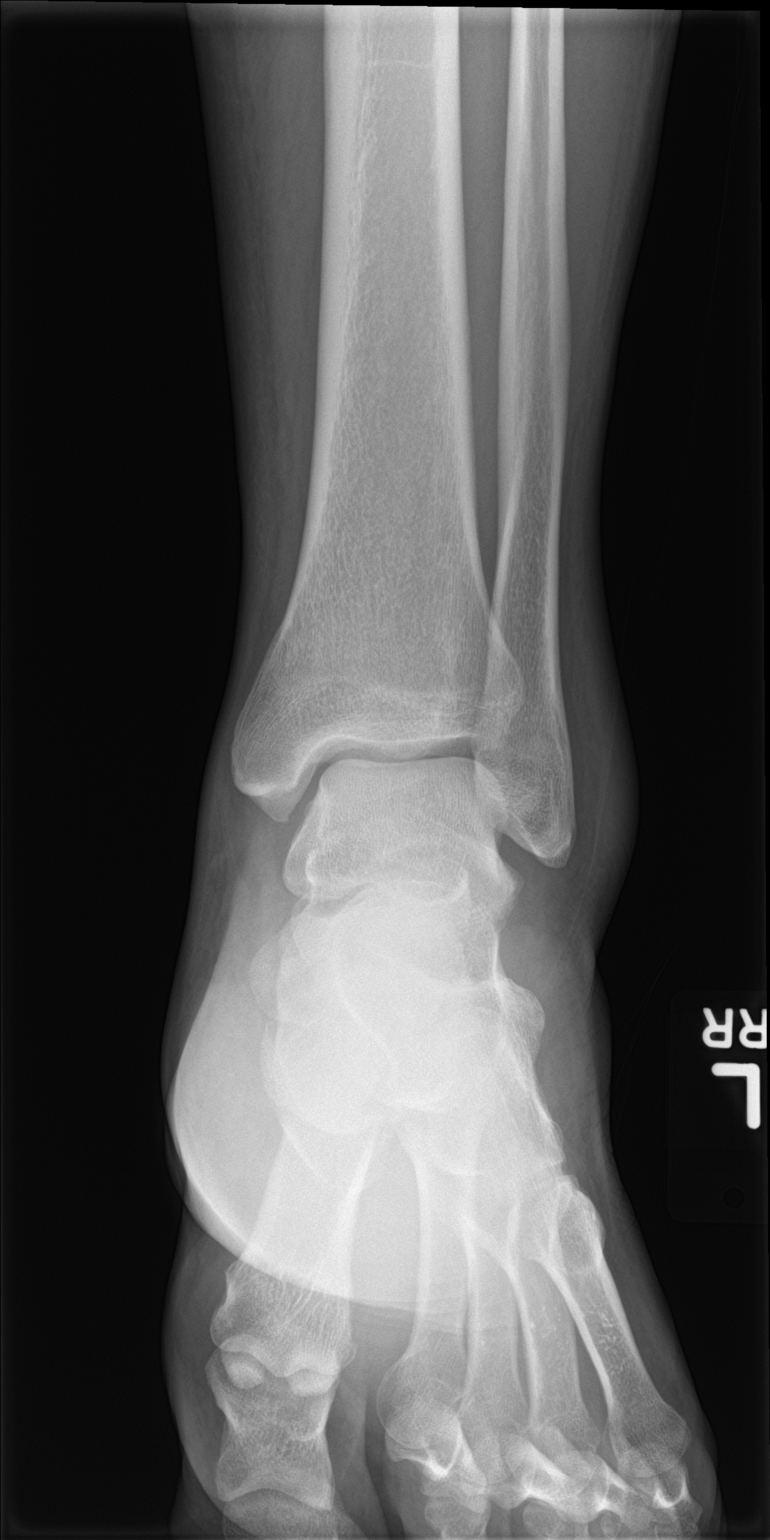

[ankle obl]
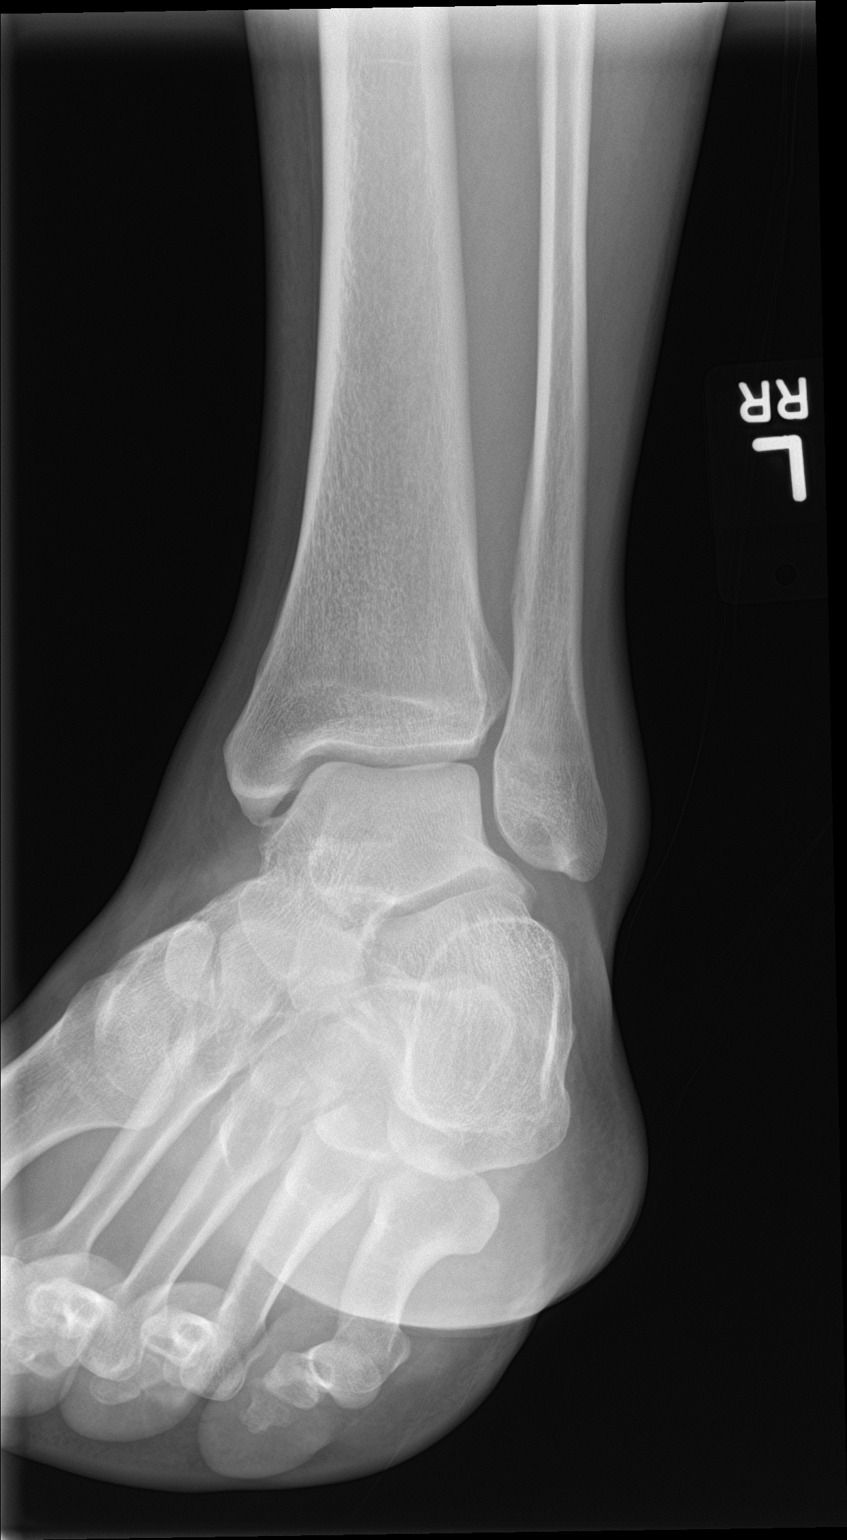

[ankle lat]
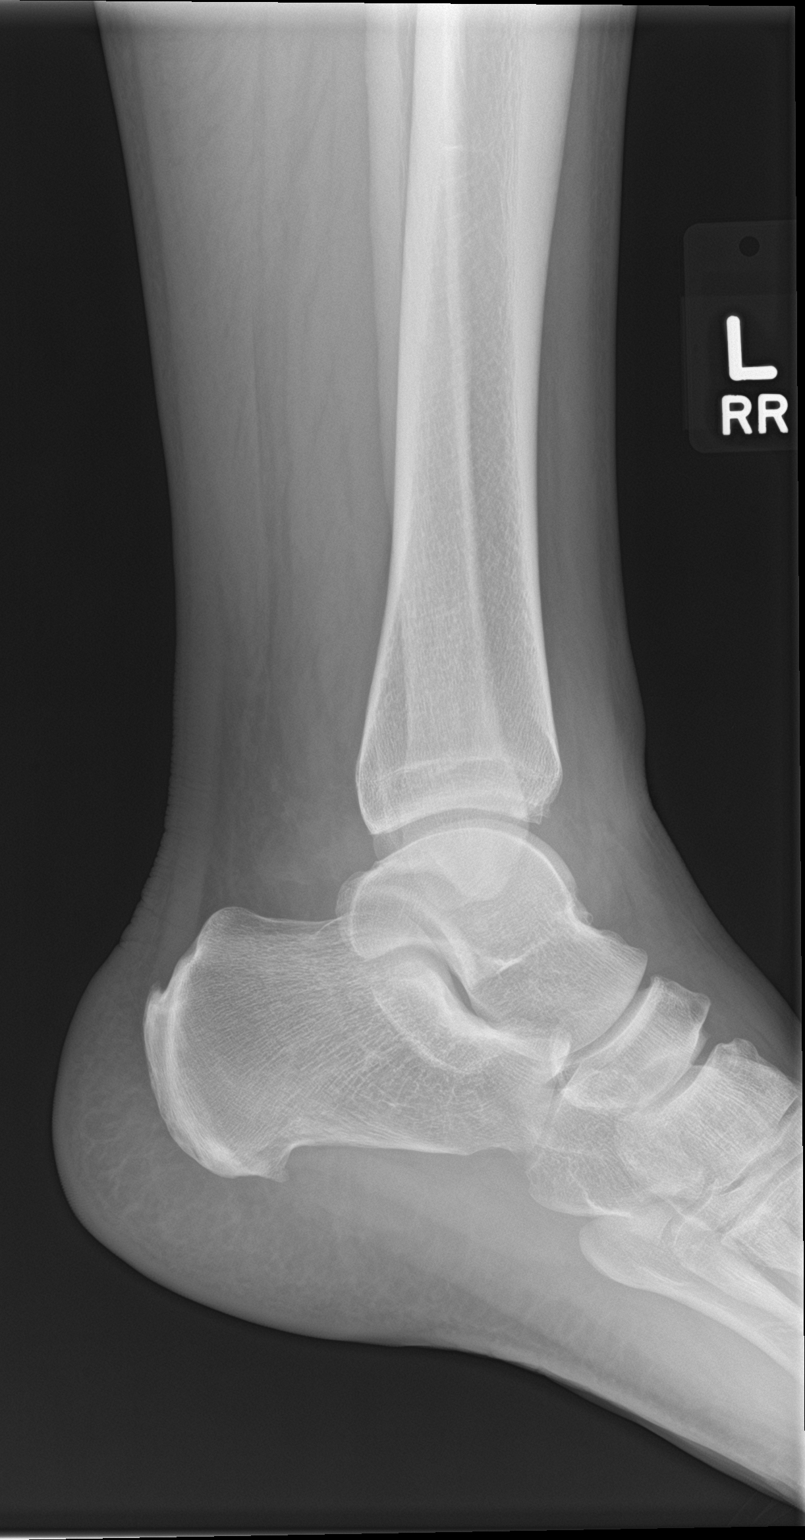

[3 of 3 positions shown; findings below may reference images not displayed]

FINDINGS: There is no acute fracture or dislocation. The bones are well
mineralized. No arthritic changes. Mild soft tissue swelling over
the lateral malleolus. No radiopaque foreign object or soft tissue
gas.
IMPRESSION: Negative.

## 2021-08-04 ENCOUNTER — Ambulatory Visit (INDEPENDENT_AMBULATORY_CARE_PROVIDER_SITE_OTHER): Payer: BC Managed Care – PPO

## 2021-08-04 ENCOUNTER — Ambulatory Visit (INDEPENDENT_AMBULATORY_CARE_PROVIDER_SITE_OTHER): Payer: BC Managed Care – PPO | Admitting: Sports Medicine

## 2021-08-04 DIAGNOSIS — G8929 Other chronic pain: Secondary | ICD-10-CM | POA: Diagnosis not present

## 2021-08-04 DIAGNOSIS — M25561 Pain in right knee: Secondary | ICD-10-CM

## 2021-08-04 NOTE — Progress Notes (Signed)
follo

## 2021-08-04 NOTE — Assessment & Plan Note (Signed)
[name] is a pleasant 41 year old male he works in [employer], he has a long history of right knee pain, after persistent worsening of posterior/lateral right knee pain he got an MRI done in [place], he does not know the results. This was a year ago at [facility] med in [place], I am not able to get the records on care everywhere. I have asked him to get his MRI results for [place]. In the meantime to give him some relief we will do an injection today. He only had minimal effusion and he had tenderness posterior joint line laterally.

## 2021-08-04 NOTE — Progress Notes (Signed)
    Procedures performed today:    Procedure: Real-time Ultrasound Guided injection of the right knee Device: Samsung HS60  Verbal informed consent obtained.  Time-out conducted.  Noted no overlying erythema, induration, or other signs of local infection.  Skin prepped in a sterile fashion.  Local anesthesia: Topical Ethyl chloride.  With sterile technique and under real time ultrasound guidance: Trace effusion noted, 1 cc Kenalog 40, 2 cc lidocaine, 2 cc bupivacaine injected easily Completed without difficulty  Advised to call if fevers/chills, erythema, induration, drainage, or persistent bleeding.  Images permanently stored and available for review in PACS.  Impression: Technically successful ultrasound guided injection.  Independent interpretation of notes and tests performed by another provider:   None.  Brief History, Exam, Impression, and Recommendations:    Chronic pain of right knee Sean Knight is a pleasant 41 year old male he works in [employer], he has a long history of right knee pain, after persistent worsening of posterior/lateral right knee pain he got an MRI done in [place], he does not know the results. This was a year ago at [facility] med in [place], I am not able to get the records on care everywhere. I have asked him to get his MRI results for [place]. In the meantime to give him some relief we will do an injection today. He only had minimal effusion and he had tenderness posterior joint line laterally.    ____________________________________________ Ihor Austin. Benjamin Stain, M.D., ABFM., CAQSM., AME. Primary Care and Sports Medicine [facility]  Adjunct Professor of Family Medicine  [employer] [facility] [employer] [employer] [facility] [employer]  Restaurant manager, fast food

## 2021-09-01 ENCOUNTER — Ambulatory Visit (INDEPENDENT_AMBULATORY_CARE_PROVIDER_SITE_OTHER): Payer: BC Managed Care – PPO | Admitting: Medical-Surgical

## 2021-09-01 ENCOUNTER — Encounter: Payer: Self-pay | Admitting: Medical-Surgical

## 2021-09-01 VITALS — BP 107/69 | HR 68 | Ht 72.0 in | Wt 257.0 lb

## 2021-09-01 DIAGNOSIS — Z1329 Encounter for screening for other suspected endocrine disorder: Secondary | ICD-10-CM | POA: Diagnosis not present

## 2021-09-01 DIAGNOSIS — Z0000 Encounter for general adult medical examination without abnormal findings: Secondary | ICD-10-CM | POA: Diagnosis not present

## 2021-09-01 DIAGNOSIS — Z9109 Other allergy status, other than to drugs and biological substances: Secondary | ICD-10-CM

## 2021-09-01 DIAGNOSIS — Z131 Encounter for screening for diabetes mellitus: Secondary | ICD-10-CM

## 2021-09-01 DIAGNOSIS — F39 Unspecified mood [affective] disorder: Secondary | ICD-10-CM | POA: Diagnosis not present

## 2021-09-01 DIAGNOSIS — Z8709 Personal history of other diseases of the respiratory system: Secondary | ICD-10-CM

## 2021-09-01 MED ORDER — ALBUTEROL SULFATE HFA 108 (90 BASE) MCG/ACT IN AERS: 2.0000 | INHALATION_SPRAY | Freq: Four times a day (QID) | RESPIRATORY_TRACT | 0 refills | Status: DC | PRN

## 2021-09-01 NOTE — Progress Notes (Signed)
+   Established Patient Office Visit  Subjective   Patient ID: Sean Knight, male   DOB: February 08, 1980 Age: 41 y.o. MRN: [id number]   No chief complaint on file.  HPI Very pleasant 41 year old male patient presenting today for general checkup.  He was last seen in our office for primary care purposes in May 2022.  Notes that his insurance lapsed for quite a while and he has recently been able to get this back in place.  Now that he does have his insurance, he would like to get general lab work and get caught up on preventative care.  He does have a history of seasonal allergies that often exacerbates coughing and wheezing related to asthma.  Previously used an albuterol inhaler on an as-needed basis and has been out of this for a while.  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  Reports that he is no longer on any medications to help with mood management or sleep.  In early August 2022, he admits to intentionally taking 14 of his sleeping pills 1 night because he did not want to have to live anymore.  He had no negative effects from that and reports that shortly after he went to stay with family/friends for a couple of months where he proceeded to get clean and come off all of his medications.  He reports that he is stopped drinking and using any other illicit substances.  Feels that he is in a better place mentally than he has been in a long time and is happy without taking medications.  No significant issues with sleeping or mood swings.  Does note that he has occasional breakthrough of "dark thoughts" but these are nowhere as severe as they used to be and are infrequent.  Denies SI/HI.  Objective:    Vitals:   09/01/21 0930  BP: 107/69  Pulse: 68  Height: 6' (1.829 m)  Weight: 257 lb (116.6 kg)  SpO2: 98%  BMI (Calculated): 34.85   Physical Exam Vitals reviewed.  Constitutional:      General: He is  not in acute distress.    Appearance: Normal appearance. He is obese. He is not ill-appearing.  HENT:     Head: Normocephalic and atraumatic.  Cardiovascular:     Rate and Rhythm: Normal rate and regular rhythm.     Pulses: Normal pulses.     Heart sounds: Normal heart sounds. No murmur heard.    No friction rub. No gallop.  Pulmonary:     Effort: Pulmonary effort is normal. No respiratory distress.     Breath sounds: Normal breath sounds.  Skin:    General: Skin is warm and dry.  Neurological:     Mental Status: He is alert and oriented to person, place, and time.  Psychiatric:        Mood and Affect: Mood normal.        Behavior: Behavior normal.        Thought Content: Thought content normal.        Judgment: Judgment normal.   No results found for this or any previous visit (from the past 24 hour(s)).     The ASCVD Risk score (Arnett DK, et al., 2019) failed to calculate for the following reasons:   Cannot find a previous HDL lab   Cannot find a previous total cholesterol lab   Assessment & Plan:  1. Preventative health care Checking labs as below. - CBC with Differential/Platelet - COMPLETE METABOLIC PANEL WITH GFR - Lipid panel  2. Diabetes mellitus screening Checking hemoglobin A1c. - Hemoglobin A1c  3. Thyroid disorder screen Checking TSH. - TSH  4. Mood disorder (HCC) Per patient report, symptoms are very stable without medications.  He is not interested in pursuing further medication management or counseling at this time.  He is aware to reach out should he change his mind.  5. Environmental allergies 6. History of asthma Would likely benefit from a daily antihistamine.  Reordering albuterol inhaler for as needed use per patient request.  Return in about 1 year (around 09/02/2022) for annual physical exam or sooner if needed.  ___________________________________________ Thayer Ohm, DNP, APRN, FNP-BC Primary Care and Sports Medicine [facility]

## 2021-09-02 LAB — HEMOGLOBIN A1C
Hgb A1c MFr Bld: 5.7 % of total Hgb — ABNORMAL HIGH (ref <5.7)
Mean Plasma Glucose: 117 mg/dL
eAG (mmol/L): 6.5 mmol/L

## 2021-09-02 LAB — CBC WITH DIFFERENTIAL/PLATELET
Absolute Monocytes: 460 cells/uL (ref 200–950)
Basophils Absolute: 32 cells/uL (ref 0–200)
Basophils Relative: 0.5 %
Eosinophils Absolute: 202 cells/uL (ref 15–500)
Eosinophils Relative: 3.2 %
HCT: 46.6 % (ref 38.5–50.0)
Hemoglobin: 15.6 g/dL (ref 13.2–17.1)
Lymphs Abs: 1978 cells/uL (ref 850–3900)
MCH: 28.8 pg (ref 27.0–33.0)
MCHC: 33.5 g/dL (ref 32.0–36.0)
MCV: 86 fL (ref 80.0–100.0)
MPV: 9.8 fL (ref 7.5–12.5)
Monocytes Relative: 7.3 %
Neutro Abs: 3629 cells/uL (ref 1500–7800)
Neutrophils Relative %: 57.6 %
Platelets: 339 10*3/uL (ref 140–400)
RBC: 5.42 10*6/uL (ref 4.20–5.80)
RDW: 12.7 % (ref 11.0–15.0)
Total Lymphocyte: 31.4 %
WBC: 6.3 10*3/uL (ref 3.8–10.8)

## 2021-09-02 LAB — COMPLETE METABOLIC PANEL WITH GFR
AG Ratio: 1.8 (calc) (ref 1.0–2.5)
ALT: 34 U/L (ref 9–46)
AST: 30 U/L (ref 10–40)
Albumin: 4.4 g/dL (ref 3.6–5.1)
Alkaline phosphatase (APISO): 75 U/L (ref 36–130)
BUN: 10 mg/dL (ref 7–25)
CO2: 27 mmol/L (ref 20–32)
Calcium: 9.6 mg/dL (ref 8.6–10.3)
Chloride: 104 mmol/L (ref 98–110)
Creat: 0.95 mg/dL (ref 0.60–1.29)
Globulin: 2.5 g/dL (calc) (ref 1.9–3.7)
Glucose, Bld: 93 mg/dL (ref 65–99)
Potassium: 4.4 mmol/L (ref 3.5–5.3)
Sodium: 139 mmol/L (ref 135–146)
Total Bilirubin: 0.6 mg/dL (ref 0.2–1.2)
Total Protein: 6.9 g/dL (ref 6.1–8.1)
eGFR: 104 mL/min/{1.73_m2} (ref >=60)

## 2021-09-02 LAB — LIPID PANEL
Cholesterol: 213 mg/dL — ABNORMAL HIGH (ref <200)
HDL: 39 mg/dL — ABNORMAL LOW (ref >=40)
LDL Cholesterol (Calc): 141 mg/dL (calc) — ABNORMAL HIGH
Non-HDL Cholesterol (Calc): 174 mg/dL (calc) — ABNORMAL HIGH (ref <130)
Total CHOL/HDL Ratio: 5.5 (calc) — ABNORMAL HIGH (ref <5.0)
Triglycerides: 193 mg/dL — ABNORMAL HIGH (ref <150)

## 2021-09-02 LAB — TSH: TSH: 1.27 mIU/L (ref 0.40–4.50)

## 2021-09-15 ENCOUNTER — Ambulatory Visit: Payer: BC Managed Care – PPO | Admitting: Sports Medicine

## 2021-12-26 ENCOUNTER — Telehealth: Payer: Self-pay | Admitting: Sports Medicine

## 2021-12-26 NOTE — Telephone Encounter (Signed)
Sean Knight stopped by to talk to me during his appointment with [name], we have still not received his MRI results from [place], he will get a family ember to take some screenshots and send them to [place] in a MyChart message.

## 2022-02-14 ENCOUNTER — Encounter: Payer: Self-pay | Admitting: Medical-Surgical

## 2022-02-14 ENCOUNTER — Ambulatory Visit (INDEPENDENT_AMBULATORY_CARE_PROVIDER_SITE_OTHER): Payer: BC Managed Care – PPO | Admitting: Family Medicine

## 2022-02-14 ENCOUNTER — Encounter: Payer: Self-pay | Admitting: Family Medicine

## 2022-02-14 VITALS — BP 124/83 | HR 82 | Temp 98.3°F | Ht 74.0 in | Wt 271.3 lb

## 2022-02-14 DIAGNOSIS — R051 Acute cough: Secondary | ICD-10-CM | POA: Diagnosis not present

## 2022-02-14 LAB — POCT INFLUENZA A/B
Influenza A, POC: NEGATIVE
Influenza B, POC: NEGATIVE

## 2022-02-14 LAB — POC COVID19 BINAXNOW: SARS Coronavirus 2 Ag: NEGATIVE

## 2022-02-14 MED ORDER — BENZONATATE 100 MG PO CAPS: 100.0000 mg | ORAL_CAPSULE | Freq: Two times a day (BID) | ORAL | 0 refills | Status: DC | PRN

## 2022-02-14 MED ORDER — ALBUTEROL SULFATE HFA 108 (90 BASE) MCG/ACT IN AERS: 2.0000 | INHALATION_SPRAY | Freq: Four times a day (QID) | RESPIRATORY_TRACT | 0 refills | Status: DC | PRN

## 2022-02-14 NOTE — Progress Notes (Signed)
Acute Office Visit  Subjective:     Patient ID: Mali A Brancato, male    DOB: [birth year], 42 y.o.   MRN: [id number]  Chief Complaint  Patient presents with   Fatigue    Patient in office c/o fatigue, SOB ( difficult to get full breath) , headache, cough ,  excessive sleepiness , bodyaches, chills,  x 4 days - patient requesting rx rf of albuterol HFA    HPI Patient is in today for acute visit. Pt is new to me.  Pt reports fatigue on Saturday with a headache. He went to work on Monday and left work early due to the rain. His headaches still persisting with fatigue. Last night, he started cough, nonproductive. He went to work this morning with headaches and fatigue. He left work early due to worsening cough. He also is having SOB with cough that started this morning. Pt denies ear ache or nasal congestion. Not checking temperature at home. Has had some chills and bodyaches. Has hx of mild intermittent asthma. Has albuterol inhaler needs refilling.   Patient Active Problem List   Diagnosis Date Noted   Chronic pain of right knee 08/04/2021   Closed fracture of left distal radius 07/31/2019   Intractable migraine without aura and without status migrainosus 05/02/2018   History of alcohol abuse 03/11/2018   Marijuana use, episodic 03/11/2018   Mood disorder ([place]) 03/06/2018   Moderate episode of recurrent major depressive disorder ([place]) 02/19/2018   GAD (generalized anxiety disorder) 02/19/2018   Adjustment disorder with mixed anxiety and depressed mood 01/22/2018   Insomnia due to psychological stress 01/22/2018   Alcohol use disorder, mild, in early remission 01/22/2018   Nicotine dependence with current use 01/22/2018   Memory difficulty 01/22/2018   Endocrine disorder, unspecified 10/18/2017   Eustachian tube dysfunction, left 10/07/2017   Gender identity disorder in adult 09/27/2017   Elevated blood pressure reading 09/27/2017   History of asthma 09/27/2017   Environmental  allergies 09/27/2017   Seasonal allergic rhinitis due to pollen 09/27/2017   Bursitis of right shoulder 08/19/2016   Sprain and strain of ankle 08/09/2013    Review of Systems  Constitutional:  Positive for chills and malaise/fatigue. Negative for fever.  HENT:  Positive for sore throat. Negative for congestion, ear pain and sinus pain.   Respiratory:  Positive for cough and shortness of breath. Negative for wheezing.   Cardiovascular:  Negative for chest pain.  All other systems reviewed and are negative.       Objective:    BP 124/83   Pulse 82   Temp 98.3 F (36.8 C)   Ht 6' 2"$  (1.88 m)   Wt 271 lb 5 oz (123.1 kg)   SpO2 98%   BMI 34.83 kg/m    Physical Exam Vitals and nursing note reviewed.  Constitutional:      Appearance: Normal appearance. He is obese.  HENT:     Head: Normocephalic and atraumatic.     Right Ear: Tympanic membrane, ear canal and external ear normal.     Left Ear: Tympanic membrane, ear canal and external ear normal.     Nose: Nose normal.     Mouth/Throat:     Mouth: Mucous membranes are moist.     Pharynx: Oropharynx is clear.  Eyes:     Conjunctiva/sclera: Conjunctivae normal.     Pupils: Pupils are equal, round, and reactive to light.  Cardiovascular:     Rate and Rhythm: Normal rate and regular  rhythm.     Pulses: Normal pulses.     Heart sounds: Normal heart sounds.  Pulmonary:     Effort: Pulmonary effort is normal.     Breath sounds: Normal breath sounds.  Abdominal:     General: Abdomen is flat. Bowel sounds are normal.  Skin:    General: Skin is warm.     Capillary Refill: Capillary refill takes less than 2 seconds.  Neurological:     General: No focal deficit present.     Mental Status: He is alert and oriented to person, place, and time. Mental status is at baseline.  Psychiatric:        Mood and Affect: Mood normal.        Behavior: Behavior normal.        Thought Content: Thought content normal.        Judgment:  Judgment normal.    No results found for any visits on 02/14/22.      Assessment & Plan:   Problem List Items Addressed This Visit   None Visit Diagnoses     Acute cough    -  Primary   Relevant Medications   albuterol (VENTOLIN HFA) 108 (90 Base) MCG/ACT inhaler   benzonatate (TESSALON) 100 MG capsule   Other Relevant Orders   POCT Influenza A/B   POC COVID-19 BinaxNow       Meds ordered this encounter  Medications   albuterol (VENTOLIN HFA) 108 (90 Base) MCG/ACT inhaler    Sig: Inhale 2 puffs into the lungs every 6 (six) hours as needed for wheezing.    Dispense:  1 each    Refill:  0    Please use generic pro-air   benzonatate (TESSALON) 100 MG capsule    Sig: Take 1 capsule (100 mg total) by mouth 2 (two) times daily as needed for cough.    Dispense:  20 capsule    Refill:  0   Flu and covid negative. Likely viral URI Refilled Albuterol inhaler to use QID prn and tessalon perles BID prn.  Out of work today-Friday return Monday.   Return if symptoms worsen or fail to improve.  Leeanne Rio, MD

## 2022-03-05 ENCOUNTER — Ambulatory Visit: Payer: BC Managed Care – PPO | Admitting: Family Medicine

## 2022-09-19 ENCOUNTER — Encounter: Payer: Self-pay | Admitting: Family Medicine

## 2022-09-19 ENCOUNTER — Ambulatory Visit (INDEPENDENT_AMBULATORY_CARE_PROVIDER_SITE_OTHER): Payer: BC Managed Care – PPO | Admitting: Family Medicine

## 2022-09-19 VITALS — BP 130/92 | HR 88 | Temp 98.4°F | Ht 74.0 in | Wt 277.0 lb

## 2022-09-19 DIAGNOSIS — R112 Nausea with vomiting, unspecified: Secondary | ICD-10-CM | POA: Diagnosis not present

## 2022-09-19 DIAGNOSIS — R52 Pain, unspecified: Secondary | ICD-10-CM

## 2022-09-19 DIAGNOSIS — R051 Acute cough: Secondary | ICD-10-CM | POA: Diagnosis not present

## 2022-09-19 HISTORY — DX: Nausea with vomiting, unspecified: R11.2

## 2022-09-19 LAB — POCT INFLUENZA A/B
Influenza A, POC: NEGATIVE
Influenza B, POC: NEGATIVE

## 2022-09-19 LAB — POC COVID19 BINAXNOW: SARS Coronavirus 2 Ag: NEGATIVE

## 2022-09-19 NOTE — Patient Instructions (Signed)
Viral Gastroenteritis, Adult  Viral gastroenteritis is also known as the stomach flu. This condition may affect your stomach, your small intestine, and your large intestine. It can cause sudden watery poop (diarrhea), fever, and vomiting. This condition is caused by certain germs (viruses). These germs can be passed from person to person very easily (are contagious). Having watery poop and vomiting can make you feel weak and cause you to not have enough water in your body (get dehydrated). This can make you tired and thirsty, make you have a dry mouth, and make it so you pee (urinate) less often. It is important to replace the fluids that you lose from having watery poop and vomiting. What are the causes? You can get sick by catching germs from other people. You can also get sick by: Eating food, drinking water, or touching a surface that has the germs on it (is contaminated). Sharing utensils or other personal items with a person who is sick. What increases the risk? Having a weak body defense system (immune system). Living with one or more children who are younger than 2 years. Living in a nursing home. Going on cruise ships. What are the signs or symptoms? Symptoms of this condition start suddenly. Symptoms may last for a few days or for as long as a week. Common symptoms include: Watery poop. Vomiting. Other symptoms include: Fever. Headache. Feeling tired (fatigue). Pain in the belly (abdomen). Chills. Feeling weak. Feeling like you may vomit (nauseous). Muscle aches. Not feeling hungry. How is this treated? This condition typically goes away on its own. The focus of treatment is to replace the fluids that you lose. This condition may be treated with: An ORS (oral rehydration solution). This is a drink that helps you replace fluids and minerals your body lost. It is sold at pharmacies and stores. Medicines to help with your symptoms. Probiotic supplements to reduce symptoms of  watery poop. Fluids given through an IV tube, if needed. Older adults and people with other diseases or a weak body defense system are at higher risk for not having enough water in the body. Follow these instructions at home: Eating and drinking  Take an ORS as told by your doctor. Drink clear fluids in small amounts as you are able. Clear fluids include: Water. Ice chips. Fruit juice that has water added to it (is diluted). Low-calorie sports drinks. Drink enough fluid to keep your pee (urine) pale yellow. Eat small amounts of healthy foods every 3-4 hours as you are able. This may include whole grains, fruits, vegetables, lean meats, and yogurt. Avoid fluids that have a lot of sugar or caffeine in them. This includes energy drinks, sports drinks, and soda. Avoid spicy or fatty foods. Avoid alcohol. General instructions  Wash your hands often. This is very important after you have watery poop or you vomit. If you cannot use soap and water, use hand sanitizer. Make sure that all people in your home wash their hands well and often. Take over-the-counter and prescription medicines only as told by your doctor. Rest at home while you get better. Watch your condition for any changes. Take a warm bath to help with any burning or pain from having watery poop. Keep all follow-up visits. Contact a doctor if: You cannot keep fluids down. Your symptoms get worse. You have new symptoms. You feel light-headed or dizzy. You have muscle cramps. Get help right away if: You have chest pain. You have trouble breathing, or you are breathing very fast.  You have a fast heartbeat. You feel very weak or you faint. You have a very bad headache, a stiff neck, or both. You have a rash. You have very bad pain, cramping, or bloating in your belly. Your skin feels cold and clammy. You feel mixed up (confused). You have pain when you pee. You have signs of not having enough water in the body, such  as: Dark pee, hardly any pee, or no pee. Cracked lips. Dry mouth. Sunken eyes. Feeling very sleepy. Feeling weak. You have signs of bleeding, such as: You see blood in your vomit. Your vomit looks like coffee grounds. You have bloody or black poop or poop that looks like tar. These symptoms may be an emergency. Get help right away. Call 911. Do not wait to see if the symptoms will go away. Do not drive yourself to the hospital. Summary Viral gastroenteritis is also known as the stomach flu. This condition can cause sudden watery poop (diarrhea), fever, and vomiting. These germs can be passed from person to person very easily. Take an ORS (oral rehydration solution) as told by your doctor. This is a drink that is sold at pharmacies and stores. Wash your hands often, especially after having watery poop or vomiting. If you cannot use soap and water, use hand sanitizer. This information is not intended to replace advice given to you by your health care provider. Make sure you discuss any questions you have with your health care provider. Document Revised: 10/17/2020 Document Reviewed: 10/17/2020 Elsevier Patient Education  2024 [employer].

## 2022-09-19 NOTE — Assessment & Plan Note (Signed)
Symptoms seem viral in nature and recommend supportive care.  Declines anti-emetic.  Will check labs today as well for evaluation of possible pancreatitis as cause as well.

## 2022-09-19 NOTE — Progress Notes (Signed)
Sean Knight - 43 y.o. male MRN [id number]  Date of birth: 30-Jan-1980  Subjective Chief Complaint  Patient presents with   Cough   Emesis   Generalized Body Aches    HPI Sean Knight is a 42 y.o. male here today with complaint of nausea, vomiting, diarrhea and cough.  Symptoms started about 5 days ago.  He has not had fever or chills.   He has not tried anything for symptom management so far.  Fluid intake is pretty good.      ROS:  A comprehensive ROS was completed and negative except as noted per HPI  No Known Allergies  Past Medical History:  Diagnosis Date   Allergy    Asthma    as a child   Complication of anesthesia    slow to wake up   Nausea and vomiting 09/19/2022   Weakness    left hand    Past Surgical History:  Procedure Laterality Date   HAND SURGERY     HERNIA REPAIR     INSERTION OF MESH N/A 06/14/2015   Procedure: INSERTION OF MESH;  Surgeon: Manus Rudd, MD;  Location: [facility] OR;  Service: General;  Laterality: N/A;   LAPAROSCOPIC ASSISTED VENTRAL HERNIA REPAIR N/A 06/14/2015   Procedure: LAPAROSCOPIC ASSISTED VENTRAL HERNIA REPAIR;  Surgeon: Manus Rudd, MD;  Location: [facility] OR;  Service: General;  Laterality: N/A;    Social History   Socioeconomic History   Marital status: Divorced    Spouse name: Not on file   Number of children: Not on file   Years of education: Not on file   Highest education level: Not on file  Occupational History   Not on file  Tobacco Use   Smoking status: Former    Current packs/day: 0.00    Types: Cigarettes    Quit date: 10/02/2002    Years since quitting: 19.9   Smokeless tobacco: Former    Types: Chew    Quit date: 09/13/2017  Vaping Use   Vaping status: Never Used  Substance and Sexual Activity   Alcohol use: Not Currently    Comment: rarely   Drug use: Yes    Types: Marijuana    Comment: Daily   Sexual activity: Not Currently    Birth control/protection: None  Other Topics Concern   Not on file   Social History Narrative   Not on file   Social Determinants of Health   Financial Resource Strain: Not on file  Food Insecurity: Not on file  Transportation Needs: Not on file  Physical Activity: Not on file  Stress: Not on file  Social Connections: Not on file    Family History  Problem Relation Age of Onset   Mental illness Mother    Hypertension Mother    Anxiety disorder Mother    Bipolar disorder Mother    Hyperlipidemia Father    Hypertension Father    Heart disease Maternal Grandmother    Heart disease Maternal Grandfather    Suicidality Maternal Uncle    Mental illness Maternal Uncle     Health Maintenance  Topic Date Due   DTaP/Tdap/Td (1 - Tdap) Never done   COVID-19 Vaccine (1 - 2023-24 season) 11/05/2022 (Originally 09/02/2022)   INFLUENZA VACCINE  04/01/2023 (Originally 08/02/2022)   Hepatitis C Screening  09/19/2023 (Originally 10/02/1998)   HIV Screening  09/19/2023 (Originally 10/02/1995)   HPV VACCINES  Aged Out     ----------------------------------------------------------------------------------------------------------------------------------------------------------------------------------------------------------------- Physical Exam BP (!) 130/92 (BP Location: Right Arm,  Patient Position: Sitting, Cuff Size: Large)   Pulse 88   Temp 98.4 F (36.9 C) (Oral)   Ht 6\' 2"  (1.88 m)   Wt 277 lb (125.6 kg)   SpO2 97%   BMI 35.56 kg/m   Physical Exam Constitutional:      Appearance: Normal appearance.  HENT:     Head: Normocephalic and atraumatic.  Eyes:     General: No scleral icterus. Cardiovascular:     Rate and Rhythm: Normal rate and regular rhythm.  Pulmonary:     Effort: Pulmonary effort is normal.     Breath sounds: Normal breath sounds.  Abdominal:     General: There is no distension.     Palpations: Abdomen is soft.     Tenderness: There is no abdominal tenderness.  Musculoskeletal:     Cervical back: Neck supple.  Neurological:      Mental Status: He is alert.     ------------------------------------------------------------------------------------------------------------------------------------------------------------------------------------------------------------------- Assessment and Plan  Nausea and vomiting Symptoms seem viral in nature and recommend supportive care.  Declines anti-emetic.  Will check labs today as well for evaluation of possible pancreatitis as cause as well.    No orders of the defined types were placed in this encounter.   No follow-ups on file.    This visit occurred during the SARS-CoV-2 public health emergency.  Safety protocols were in place, including screening questions prior to the visit, additional usage of staff PPE, and extensive cleaning of exam room while observing appropriate contact time as indicated for disinfecting solutions.

## 2022-09-19 NOTE — Addendum Note (Signed)
Addended by: Mammie Lorenzo on: 09/19/2022 10:12 AM   Modules accepted: Orders

## 2022-09-20 LAB — CMP14+EGFR
ALT: 33 IU/L (ref 0–44)
AST: 33 IU/L (ref 0–40)
Albumin: 4.5 g/dL (ref 4.1–5.1)
Alkaline Phosphatase: 86 IU/L (ref 44–121)
BUN/Creatinine Ratio: 11 (ref 9–20)
BUN: 10 mg/dL (ref 6–24)
Bilirubin Total: 0.4 mg/dL (ref 0.0–1.2)
CO2: 24 mmol/L (ref 20–29)
Calcium: 9.7 mg/dL (ref 8.7–10.2)
Chloride: 101 mmol/L (ref 96–106)
Creatinine, Ser: 0.89 mg/dL (ref 0.76–1.27)
Globulin, Total: 2.5 g/dL (ref 1.5–4.5)
Glucose: 101 mg/dL — ABNORMAL HIGH (ref 70–99)
Potassium: 4.7 mmol/L (ref 3.5–5.2)
Sodium: 140 mmol/L (ref 134–144)
Total Protein: 7 g/dL (ref 6.0–8.5)
eGFR: 110 mL/min/{1.73_m2} (ref >59)

## 2022-09-20 LAB — CBC WITH DIFFERENTIAL/PLATELET
Basophils Absolute: 0 10*3/uL (ref 0.0–0.2)
Basos: 1 %
EOS (ABSOLUTE): 0.2 10*3/uL (ref 0.0–0.4)
Eos: 3 %
Hematocrit: 49.5 % (ref 37.5–51.0)
Hemoglobin: 15.5 g/dL (ref 13.0–17.7)
Immature Grans (Abs): 0.1 10*3/uL (ref 0.0–0.1)
Immature Granulocytes: 1 %
Lymphocytes Absolute: 2.2 10*3/uL (ref 0.7–3.1)
Lymphs: 32 %
MCH: 28 pg (ref 26.6–33.0)
MCHC: 31.3 g/dL — ABNORMAL LOW (ref 31.5–35.7)
MCV: 89 fL (ref 79–97)
Monocytes Absolute: 0.5 10*3/uL (ref 0.1–0.9)
Monocytes: 7 %
Neutrophils Absolute: 3.9 10*3/uL (ref 1.4–7.0)
Neutrophils: 56 %
Platelets: 332 10*3/uL (ref 150–450)
RBC: 5.54 x10E6/uL (ref 4.14–5.80)
RDW: 12.2 % (ref 11.6–15.4)
WBC: 6.9 10*3/uL (ref 3.4–10.8)

## 2022-09-20 LAB — LIPASE: Lipase: 36 U/L (ref 13–78)

## 2023-06-03 ENCOUNTER — Telehealth (INDEPENDENT_AMBULATORY_CARE_PROVIDER_SITE_OTHER): Admitting: Physician Assistant

## 2023-06-03 ENCOUNTER — Encounter: Payer: Self-pay | Admitting: Physician Assistant

## 2023-06-03 VITALS — HR 100

## 2023-06-03 DIAGNOSIS — R051 Acute cough: Secondary | ICD-10-CM

## 2023-06-03 DIAGNOSIS — B349 Viral infection, unspecified: Secondary | ICD-10-CM

## 2023-06-03 DIAGNOSIS — R0602 Shortness of breath: Secondary | ICD-10-CM

## 2023-06-03 MED ORDER — ALBUTEROL SULFATE HFA 108 (90 BASE) MCG/ACT IN AERS: 2.0000 | INHALATION_SPRAY | Freq: Four times a day (QID) | RESPIRATORY_TRACT | 0 refills | Status: DC | PRN

## 2023-06-03 NOTE — Progress Notes (Signed)
..  Virtual Visit via Video Note  I connected with Sean Knight on 06/03/23 at  3:40 PM EDT by a video enabled telemedicine application and verified that I am speaking with the correct person using two identifiers.  Location: Patient: home Provider: clinic  .Aaron AasParticipating in visit:  Patient: Sean Provider: Sandy Crumb PA-C Provider in training: Maylene Spear PA-S   I discussed the limitations of evaluation and management by telemedicine and the availability of in person appointments. The patient expressed understanding and agreed to proceed.  History of Present Illness: Pt is a 43 yo obese male who calls into the clinic "not feeling well at all". He wants a note for work because he has a high risk job and works around a lot of [employer] areas that he cannot make a mistake. He had asthma as a kid and tens to need to use albuterol  inhaler when he gets sick. He has been using albuterol  inhaler more lately. He has had symptoms for the last week. He is exhausted. He has no appetite, endorses body aches, chills, SOB, cough. Cough is intermittent and not severe. He has a mild sore throat that is worse in mornings. He denies any swelling. He has not tested for covid or flu. He did not get vaccines or boosters. He has been taking advil cold and sinus with little relief. He does not smoke.    Observations/Objective: No acute distress Normal breathing or no excessive coughing Pale appearance  .[name] Today's Vitals   06/03/23 1557  Pulse: 100   There is no height or weight on file to calculate BMI.     Assessment and Plan: Aaron AasAaron Aas[place] was seen today for medical management of chronic issues.  Diagnoses and all orders for this visit:  Viral syndrome -     CBC w/Diff/Platelet -     TSH -     EBV ab to viral capsid ag pnl, IgG+IgM -     CMV abs, IgG+IgM (cytomegalovirus) -     CMP14+EGFR -     DG Chest 2 View; Future  Acute cough -     albuterol  (VENTOLIN  HFA) 108 (90 Base) MCG/ACT  inhaler; Inhale 2 puffs into the lungs every 6 (six) hours as needed for wheezing. -     DG Chest 2 View; Future  SOB (shortness of breath) -     DG Chest 2 View; Future   Unclear etiology of symptoms but likely viral Agree with patient he needs to be written out of work until he starts to feel a little better Note given through until Thursday Come into clinic and get some labs to evaluate fatigue and get a CXR to rule out walking pneumonia Albuterol  refilled to use as needed Rest and hydrate Follow up as needed if symptoms persist or worsen   Follow Up Instructions:    I discussed the assessment and treatment plan with the patient. The patient was provided an opportunity to ask questions and all were answered. The patient agreed with the plan and demonstrated an understanding of the instructions.   The patient was advised to call back or seek an in-person evaluation if the symptoms worsen or if the condition fails to improve as anticipated.   [name], PA-C

## 2023-06-04 ENCOUNTER — Encounter: Payer: Self-pay | Admitting: Physician Assistant

## 2023-06-04 ENCOUNTER — Ambulatory Visit: Payer: Self-pay | Admitting: Physician Assistant

## 2023-06-04 ENCOUNTER — Ambulatory Visit (INDEPENDENT_AMBULATORY_CARE_PROVIDER_SITE_OTHER)

## 2023-06-04 DIAGNOSIS — R0602 Shortness of breath: Secondary | ICD-10-CM | POA: Diagnosis not present

## 2023-06-04 DIAGNOSIS — R059 Cough, unspecified: Secondary | ICD-10-CM | POA: Diagnosis not present

## 2023-06-04 DIAGNOSIS — R051 Acute cough: Secondary | ICD-10-CM

## 2023-06-04 DIAGNOSIS — B349 Viral infection, unspecified: Secondary | ICD-10-CM | POA: Diagnosis not present

## 2023-06-04 NOTE — Progress Notes (Signed)
 Normal chest x-ray.

## 2023-06-05 NOTE — Progress Notes (Signed)
 No acute worrisome findings. How are you feeling today?

## 2023-06-06 LAB — CBC WITH DIFFERENTIAL/PLATELET
Basophils Absolute: 0 10*3/uL (ref 0.0–0.2)
Basos: 0 %
EOS (ABSOLUTE): 0.2 10*3/uL (ref 0.0–0.4)
Eos: 2 %
Hematocrit: 50.6 % (ref 37.5–51.0)
Hemoglobin: 16.5 g/dL (ref 13.0–17.7)
Immature Grans (Abs): 0 10*3/uL (ref 0.0–0.1)
Immature Granulocytes: 0 %
Lymphocytes Absolute: 2.9 10*3/uL (ref 0.7–3.1)
Lymphs: 31 %
MCH: 29.2 pg (ref 26.6–33.0)
MCHC: 32.6 g/dL (ref 31.5–35.7)
MCV: 89 fL (ref 79–97)
Monocytes Absolute: 0.5 10*3/uL (ref 0.1–0.9)
Monocytes: 6 %
Neutrophils Absolute: 5.5 10*3/uL (ref 1.4–7.0)
Neutrophils: 61 %
Platelets: 356 10*3/uL (ref 150–450)
RBC: 5.66 x10E6/uL (ref 4.14–5.80)
RDW: 12.8 % (ref 11.6–15.4)
WBC: 9.1 10*3/uL (ref 3.4–10.8)

## 2023-06-06 LAB — CMP14+EGFR
ALT: 31 IU/L (ref 0–44)
AST: 37 IU/L (ref 0–40)
Albumin: 4.7 g/dL (ref 4.1–5.1)
Alkaline Phosphatase: 102 IU/L (ref 44–121)
BUN/Creatinine Ratio: 10 (ref 9–20)
BUN: 8 mg/dL (ref 6–24)
Bilirubin Total: 0.5 mg/dL (ref 0.0–1.2)
CO2: 21 mmol/L (ref 20–29)
Calcium: 9.9 mg/dL (ref 8.7–10.2)
Chloride: 101 mmol/L (ref 96–106)
Creatinine, Ser: 0.83 mg/dL (ref 0.76–1.27)
Globulin, Total: 2.7 g/dL (ref 1.5–4.5)
Glucose: 101 mg/dL — ABNORMAL HIGH (ref 70–99)
Potassium: 4.3 mmol/L (ref 3.5–5.2)
Sodium: 141 mmol/L (ref 134–144)
Total Protein: 7.4 g/dL (ref 6.0–8.5)
eGFR: 112 mL/min/{1.73_m2} (ref >59)

## 2023-06-06 LAB — EBV AB TO VIRAL CAPSID AG PNL, IGG+IGM: EBV VCA IgG: 428 U/mL — ABNORMAL HIGH (ref 0.0–17.9)

## 2023-06-06 LAB — CMV ABS, IGG+IGM (CYTOMEGALOVIRUS)
CMV Ab - IgG: 1.7 U/mL — ABNORMAL HIGH (ref 0.00–0.59)
CMV IgM Ser EIA-aCnc: <30 [AU]/ml (ref 0.0–29.9)

## 2023-06-06 LAB — TSH: TSH: 1.74 u[IU]/mL (ref 0.450–4.500)

## 2023-06-07 NOTE — Progress Notes (Signed)
 No those are you antibody response that your body has made to encountering the virus in the past. It is a good thing.

## 2023-09-03 ENCOUNTER — Encounter: Payer: Self-pay | Admitting: Sports Medicine

## 2023-10-09 ENCOUNTER — Ambulatory Visit: Payer: Self-pay

## 2023-10-09 NOTE — Telephone Encounter (Signed)
 FYI Only or Action Required?: FYI only for provider.  Patient was last seen in primary care on 06/03/2023 by Antoniette Vermell CROME, PA-C.  Called Nurse Triage reporting Cough.  Symptoms began a week ago.  Interventions attempted: OTC medications: Aleve.  Symptoms are: unchanged.  Triage Disposition: See Physician Within 24 Hours  Patient/caregiver understands and will follow disposition?: Yes  **Appt. Scheduled for 10/9**        Copied from CRM #[id number]. Topic: Clinical - Red Word Triage >> Oct 09, 2023  3:31 PM Donna BRAVO wrote: Red Word that prompted transfer to Nurse Triage: patient has not been feeling well,  Symptoms: Nausea every now and then Hot flashes, sweating all over Cold clammy skin Short of breath on occasion // can't take a full breath Coughing up phlegm no color Muscle aches // body aches  Yesterday had pain in middle of breast bone right dead center lasted 3 minutes Reason for Disposition  [1] Continuous (nonstop) coughing interferes with work or school AND [2] no improvement using cough treatment per Care Advice  Answer Assessment - Initial Assessment Questions 1. ONSET: When did the cough begin?      X 1 week   2. SEVERITY: How bad is the cough today?      Intermittent   3. SPUTUM: Describe the color of your sputum (e.g., none, dry cough; clear, white, yellow, green)     Clear  4. HEMOPTYSIS: Are you coughing up any blood? If Yes, ask: How much? (e.g., flecks, streaks, tablespoons, etc.)     No   5. DIFFICULTY BREATHING: Are you having difficulty breathing? If Yes, ask: How bad is it? (e.g., mild, moderate, severe)      No, but stated chest hurts when trying to take a full breath  6. FEVER: Do you have a fever? If Yes, ask: What is your temperature, how was it measured, and when did it start?     No   7. CARDIAC HISTORY: Do you have any history of heart disease? (e.g., heart attack, congestive heart failure)      No   8. LUNG  HISTORY: Do you have any history of lung disease?  (e.g., pulmonary embolus, asthma, emphysema)      Childhood asthma, uses inhaler; provides relief.   9. PE RISK FACTORS: Do you have a history of blood clots? (or: recent major surgery, recent prolonged travel, bedridden)     No   10. OTHER SYMPTOMS: Do you have any other symptoms? (e.g., runny nose, wheezing, chest pain)  Nausea every now and then Hot flashes, sweating all over Cold clammy skin Muscle aches // body aches  Yesterday had pain in middle of breast bone right dead center lasted 3 minutes; since resolved; related to cough   Patient has not taken a Covid test. Patient is taking Aleve for the body aches/ muscle aches, he took a dose yesterday on 10/7. Appt. Scheduled for 10/9 to follow-up. Patient agrees to be seen in ED/C if any SOB, chest pain occurs.  Protocols used: Cough - Acute Productive-A-AH

## 2023-10-09 NOTE — Telephone Encounter (Signed)
 Patient scheduled.

## 2023-10-10 ENCOUNTER — Ambulatory Visit (INDEPENDENT_AMBULATORY_CARE_PROVIDER_SITE_OTHER)

## 2023-10-10 ENCOUNTER — Ambulatory Visit (INDEPENDENT_AMBULATORY_CARE_PROVIDER_SITE_OTHER): Admitting: Medical-Surgical

## 2023-10-10 ENCOUNTER — Encounter: Payer: Self-pay | Admitting: Medical-Surgical

## 2023-10-10 VITALS — BP 125/84 | HR 81 | Temp 99.7°F | Ht 74.0 in | Wt 285.0 lb

## 2023-10-10 DIAGNOSIS — R051 Acute cough: Secondary | ICD-10-CM

## 2023-10-10 DIAGNOSIS — R0689 Other abnormalities of breathing: Secondary | ICD-10-CM

## 2023-10-10 DIAGNOSIS — M791 Myalgia, unspecified site: Secondary | ICD-10-CM | POA: Diagnosis not present

## 2023-10-10 DIAGNOSIS — J069 Acute upper respiratory infection, unspecified: Secondary | ICD-10-CM | POA: Diagnosis not present

## 2023-10-10 DIAGNOSIS — R5383 Other fatigue: Secondary | ICD-10-CM

## 2023-10-10 DIAGNOSIS — R059 Cough, unspecified: Secondary | ICD-10-CM | POA: Diagnosis not present

## 2023-10-10 DIAGNOSIS — R918 Other nonspecific abnormal finding of lung field: Secondary | ICD-10-CM | POA: Diagnosis not present

## 2023-10-10 LAB — POC COVID19/FLU A&B COMBO
Covid Antigen, POC: NEGATIVE
Influenza A Antigen, POC: NEGATIVE
Influenza B Antigen, POC: NEGATIVE

## 2023-10-10 MED ORDER — METHYLPREDNISOLONE 4 MG PO TBPK: ORAL_TABLET | ORAL | 0 refills | Status: AC

## 2023-10-10 MED ORDER — AIRSUPRA 90-80 MCG/ACT IN AERO: 2.0000 | INHALATION_SPRAY | Freq: Four times a day (QID) | RESPIRATORY_TRACT | 11 refills | Status: AC | PRN

## 2023-10-10 NOTE — Progress Notes (Signed)
 Established patient visit   History of Present Illness   Discussed the use of AI scribe software for clinical note transcription with the patient, who gave verbal consent to proceed.  History of Present Illness 43 year male presents to the office to day with back pain and a occasional cough that started about a week and a half ago. Patient states that his symptoms worsened about 3-4 days ago with worsening cough accompanied by occasional clear mucus production, runny nose, postnasal drip, and scratchy throat. He sates that he had an episode of hot flashes and chest pressure while at work yesterday which resolved after about 3-4 minutes. He has taken over the counter aleve for pain, but has not taken any cold medication. Patient states that he has been using his inhaler more aout 2-3 times per day. He denies any known recent sick contacts. 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 States that he has had some chills, and chest pain only with cough. Denies fever.      Review of Systems  Constitutional:  Positive for chills.  HENT:  Positive for congestion and sore throat.   Eyes:  Positive for discharge and redness.  Respiratory:  Positive for cough and shortness of breath.   Cardiovascular:  Positive for chest pain (chest pain w/ cough).     Physical Exam   Physical Exam Constitutional:      General: He is not in acute distress.    Appearance: He is well-developed and normal weight. He is ill-appearing.  HENT:     Right Ear: Tympanic membrane normal.     Left Ear: Tympanic membrane normal.     Nose: Nose normal.     Mouth/Throat:     Mouth: Mucous membranes are moist.     Pharynx: Oropharynx is clear.     Tonsils: No tonsillar exudate or tonsillar abscesses.  Eyes:     Comments: Mucopurulent discharge in left eye Redness in bilaterally  Cardiovascular:     Rate and Rhythm: Normal rate and regular rhythm.     Heart sounds: Normal heart sounds, S1 normal and S2  normal.  Pulmonary:     Effort: Pulmonary effort is normal.     Breath sounds: Decreased air movement present. Decreased breath sounds present. No wheezing, rhonchi or rales.  Lymphadenopathy:     Head:     Right side of head: No submental, submandibular, tonsillar, preauricular or posterior auricular adenopathy.     Left side of head: No submental, submandibular, tonsillar, preauricular or posterior auricular adenopathy.     Cervical: No cervical adenopathy.  Skin:    General: Skin is warm and dry.  Neurological:     General: No focal deficit present.     Mental Status: He is alert and oriented to person, place, and time.  Psychiatric:        Mood and Affect: Mood normal.        Behavior: Behavior normal.    Assessment & Plan   Problem List Items Addressed This Visit   None Visit Diagnoses       Acute cough    -  Primary   Relevant Orders   DG Chest 2 View   POC Covid19/Flu A&B Antigen (Completed)     Decreased breath sounds       Relevant Orders   DG Chest 2 View   POC Covid19/Flu A&B Antigen (Completed)     Upper respiratory  tract infection, unspecified type       Relevant Orders   POC Covid19/Flu A&B Antigen (Completed)      Assessment and Plan   1. Acute cough (Primary) -Cough x 1.5 week -Rx for 6 day Medrol Dose Pack sent to pharmacy -Will consider antibiotic treatement pending chest x-ray results - DG Chest 2 View; Future - POC Covid19/Flu A&B Antigen  2. Decreased breath sounds - DG Chest 2 View; Future - POC Covid19/Flu A&B Antigen -Will consider antibiotic treatment pending chest x-ray results - Start Airsupra in place of Albuterol , reviewed administration instructions, coupon card provided.  3. Upper respiratory tract infection, unspecified type -Negative poc Flu A/B and Covid swab - POC Covid19/Flu A&B Antigen     Follow up   Return if symptoms worsen or fail to improve. __________________________________ Zada FREDRIK Palin, DNP, APRN,  FNP-BC Primary Care and Sports Medicine [facility] [place]

## 2023-10-14 ENCOUNTER — Ambulatory Visit: Payer: Self-pay | Admitting: Medical-Surgical

## 2023-10-14 MED ORDER — AZITHROMYCIN 250 MG PO TABS: ORAL_TABLET | ORAL | 0 refills | Status: AC

## 2023-11-07 ENCOUNTER — Ambulatory Visit (INDEPENDENT_AMBULATORY_CARE_PROVIDER_SITE_OTHER)

## 2023-11-07 ENCOUNTER — Other Ambulatory Visit: Payer: Self-pay | Admitting: Medical-Surgical

## 2023-11-07 DIAGNOSIS — Z0389 Encounter for observation for other suspected diseases and conditions ruled out: Secondary | ICD-10-CM | POA: Diagnosis not present

## 2023-11-07 DIAGNOSIS — J069 Acute upper respiratory infection, unspecified: Secondary | ICD-10-CM

## 2023-11-12 ENCOUNTER — Ambulatory Visit: Payer: Self-pay | Admitting: Medical-Surgical
